# Patient Record
Sex: Female | Born: 1975 | Race: Black or African American | Hispanic: No | Marital: Married | Smoking: Never smoker
Health system: Southern US, Community
[De-identification: ages and names within clinical notes are randomized; demographics above are authoritative.]

## PROBLEM LIST (undated history)

## (undated) ENCOUNTER — Inpatient Hospital Stay (HOSPITAL_COMMUNITY): Payer: Self-pay

## (undated) DIAGNOSIS — K219 Gastro-esophageal reflux disease without esophagitis: Secondary | ICD-10-CM

## (undated) DIAGNOSIS — N979 Female infertility, unspecified: Secondary | ICD-10-CM

## (undated) DIAGNOSIS — E282 Polycystic ovarian syndrome: Secondary | ICD-10-CM

## (undated) DIAGNOSIS — G4730 Sleep apnea, unspecified: Secondary | ICD-10-CM

## (undated) DIAGNOSIS — I10 Essential (primary) hypertension: Secondary | ICD-10-CM

## (undated) DIAGNOSIS — E229 Hyperfunction of pituitary gland, unspecified: Secondary | ICD-10-CM

## (undated) DIAGNOSIS — E785 Hyperlipidemia, unspecified: Secondary | ICD-10-CM

## (undated) DIAGNOSIS — N970 Female infertility associated with anovulation: Secondary | ICD-10-CM

## (undated) DIAGNOSIS — O039 Complete or unspecified spontaneous abortion without complication: Secondary | ICD-10-CM

## (undated) HISTORY — PX: LAPAROSCOPIC OVARIAN CYSTECTOMY: SUR786

## (undated) HISTORY — PX: OTHER SURGICAL HISTORY: SHX169

## (undated) HISTORY — DX: Hyperfunction of pituitary gland, unspecified: E22.9

## (undated) HISTORY — DX: Essential (primary) hypertension: I10

## (undated) HISTORY — DX: Gastro-esophageal reflux disease without esophagitis: K21.9

## (undated) HISTORY — PX: TUBAL LIGATION: SHX77

## (undated) HISTORY — PX: WISDOM TOOTH EXTRACTION: SHX21

## (undated) HISTORY — PX: DILATION AND CURETTAGE OF UTERUS: SHX78

## (undated) HISTORY — DX: Hyperlipidemia, unspecified: E78.5

## (undated) HISTORY — PX: CHOLECYSTECTOMY: SHX55

## (undated) HISTORY — DX: Polycystic ovarian syndrome: E28.2

## (undated) HISTORY — DX: Female infertility associated with anovulation: N97.0

## (undated) HISTORY — DX: Sleep apnea, unspecified: G47.30

## (undated) HISTORY — DX: Complete or unspecified spontaneous abortion without complication: O03.9

## (undated) HISTORY — DX: Female infertility, unspecified: N97.9

---

## 1998-01-26 ENCOUNTER — Ambulatory Visit (HOSPITAL_COMMUNITY): Admission: RE | Admit: 1998-01-26 | Discharge: 1998-01-27 | Payer: Self-pay | Admitting: Surgery

## 1998-06-17 ENCOUNTER — Ambulatory Visit (HOSPITAL_COMMUNITY): Admission: RE | Admit: 1998-06-17 | Discharge: 1998-06-17 | Payer: Self-pay | Admitting: Endocrinology

## 1998-06-17 ENCOUNTER — Encounter: Payer: Self-pay | Admitting: Endocrinology

## 1999-03-02 ENCOUNTER — Ambulatory Visit (HOSPITAL_COMMUNITY): Admission: RE | Admit: 1999-03-02 | Discharge: 1999-03-02 | Payer: Self-pay | Admitting: *Deleted

## 1999-05-08 ENCOUNTER — Emergency Department (HOSPITAL_COMMUNITY): Admission: EM | Admit: 1999-05-08 | Discharge: 1999-05-08 | Payer: Self-pay | Admitting: Emergency Medicine

## 2000-12-19 ENCOUNTER — Other Ambulatory Visit: Admission: RE | Admit: 2000-12-19 | Discharge: 2000-12-19 | Payer: Self-pay | Admitting: *Deleted

## 2001-01-08 ENCOUNTER — Emergency Department (HOSPITAL_COMMUNITY): Admission: EM | Admit: 2001-01-08 | Discharge: 2001-01-08 | Payer: Self-pay | Admitting: Emergency Medicine

## 2002-09-08 ENCOUNTER — Other Ambulatory Visit: Admission: RE | Admit: 2002-09-08 | Discharge: 2002-09-08 | Payer: Self-pay | Admitting: *Deleted

## 2003-10-18 ENCOUNTER — Other Ambulatory Visit: Admission: RE | Admit: 2003-10-18 | Discharge: 2003-10-18 | Payer: Self-pay | Admitting: Obstetrics and Gynecology

## 2005-07-20 ENCOUNTER — Ambulatory Visit: Payer: Self-pay | Admitting: Family Medicine

## 2005-10-02 ENCOUNTER — Ambulatory Visit: Payer: Self-pay | Admitting: Family Medicine

## 2006-01-09 ENCOUNTER — Ambulatory Visit: Payer: Self-pay | Admitting: Family Medicine

## 2006-08-07 ENCOUNTER — Ambulatory Visit: Payer: Self-pay | Admitting: Family Medicine

## 2007-05-05 ENCOUNTER — Emergency Department (HOSPITAL_COMMUNITY): Admission: EM | Admit: 2007-05-05 | Discharge: 2007-05-05 | Payer: Self-pay | Admitting: Emergency Medicine

## 2007-12-24 ENCOUNTER — Encounter (INDEPENDENT_AMBULATORY_CARE_PROVIDER_SITE_OTHER): Payer: Self-pay | Admitting: *Deleted

## 2008-08-03 ENCOUNTER — Ambulatory Visit: Payer: Self-pay | Admitting: Family Medicine

## 2008-08-03 DIAGNOSIS — E785 Hyperlipidemia, unspecified: Secondary | ICD-10-CM

## 2008-08-03 DIAGNOSIS — I10 Essential (primary) hypertension: Secondary | ICD-10-CM

## 2008-08-10 LAB — CONVERTED CEMR LAB
ALT: 19 units/L (ref 0–35)
AST: 22 units/L (ref 0–37)
Albumin: 3.8 g/dL (ref 3.5–5.2)
Alkaline Phosphatase: 53 units/L (ref 39–117)
BUN: 16 mg/dL (ref 6–23)
Basophils Absolute: 0 10*3/uL (ref 0.0–0.1)
Basophils Relative: 0 % (ref 0.0–3.0)
Bilirubin, Direct: 0.1 mg/dL (ref 0.0–0.3)
CO2: 31 meq/L (ref 19–32)
Calcium: 9 mg/dL (ref 8.4–10.5)
Chloride: 105 meq/L (ref 96–112)
Cholesterol: 234 mg/dL (ref 0–200)
Creatinine, Ser: 1 mg/dL (ref 0.4–1.2)
Direct LDL: 169.5 mg/dL
Eosinophils Absolute: 0.2 10*3/uL (ref 0.0–0.7)
Eosinophils Relative: 3.3 % (ref 0.0–5.0)
GFR calc Af Amer: 83 mL/min
GFR calc non Af Amer: 69 mL/min
Glucose, Bld: 88 mg/dL (ref 70–99)
HCT: 40.5 % (ref 36.0–46.0)
HDL: 47.8 mg/dL (ref >39.0)
Hemoglobin: 14.2 g/dL (ref 12.0–15.0)
Lymphocytes Relative: 35.2 % (ref 12.0–46.0)
MCHC: 35.2 g/dL (ref 30.0–36.0)
MCV: 95.5 fL (ref 78.0–100.0)
Monocytes Absolute: 0.2 10*3/uL (ref 0.1–1.0)
Monocytes Relative: 4.2 % (ref 3.0–12.0)
Neutro Abs: 2.8 10*3/uL (ref 1.4–7.7)
Neutrophils Relative %: 57.3 % (ref 43.0–77.0)
Phosphorus: 3.4 mg/dL (ref 2.3–4.6)
Platelets: 247 10*3/uL (ref 150–400)
Potassium: 4 meq/L (ref 3.5–5.1)
RBC: 4.24 M/uL (ref 3.87–5.11)
RDW: 11.9 % (ref 11.5–14.6)
Sodium: 141 meq/L (ref 135–145)
TSH: 0.96 microintl units/mL (ref 0.35–5.50)
Total Bilirubin: 0.6 mg/dL (ref 0.3–1.2)
Total CHOL/HDL Ratio: 4.9
Total Protein: 7.1 g/dL (ref 6.0–8.3)
Triglycerides: 124 mg/dL (ref 0–149)
VLDL: 25 mg/dL (ref 0–40)
WBC: 5 10*3/uL (ref 4.5–10.5)

## 2008-09-09 ENCOUNTER — Encounter (INDEPENDENT_AMBULATORY_CARE_PROVIDER_SITE_OTHER): Payer: Self-pay | Admitting: *Deleted

## 2008-09-09 ENCOUNTER — Ambulatory Visit: Payer: Self-pay | Admitting: Family Medicine

## 2008-09-09 DIAGNOSIS — M545 Low back pain, unspecified: Secondary | ICD-10-CM | POA: Insufficient documentation

## 2008-09-09 DIAGNOSIS — IMO0002 Reserved for concepts with insufficient information to code with codable children: Secondary | ICD-10-CM

## 2008-11-25 IMAGING — CR DG CHEST 2V
2 series · 2 of 2 positions shown · non-contrast
Comparison: none

CLINICAL DATA: Pain below left breast.
 CHEST ? 2 VIEW:

[w chest pa]
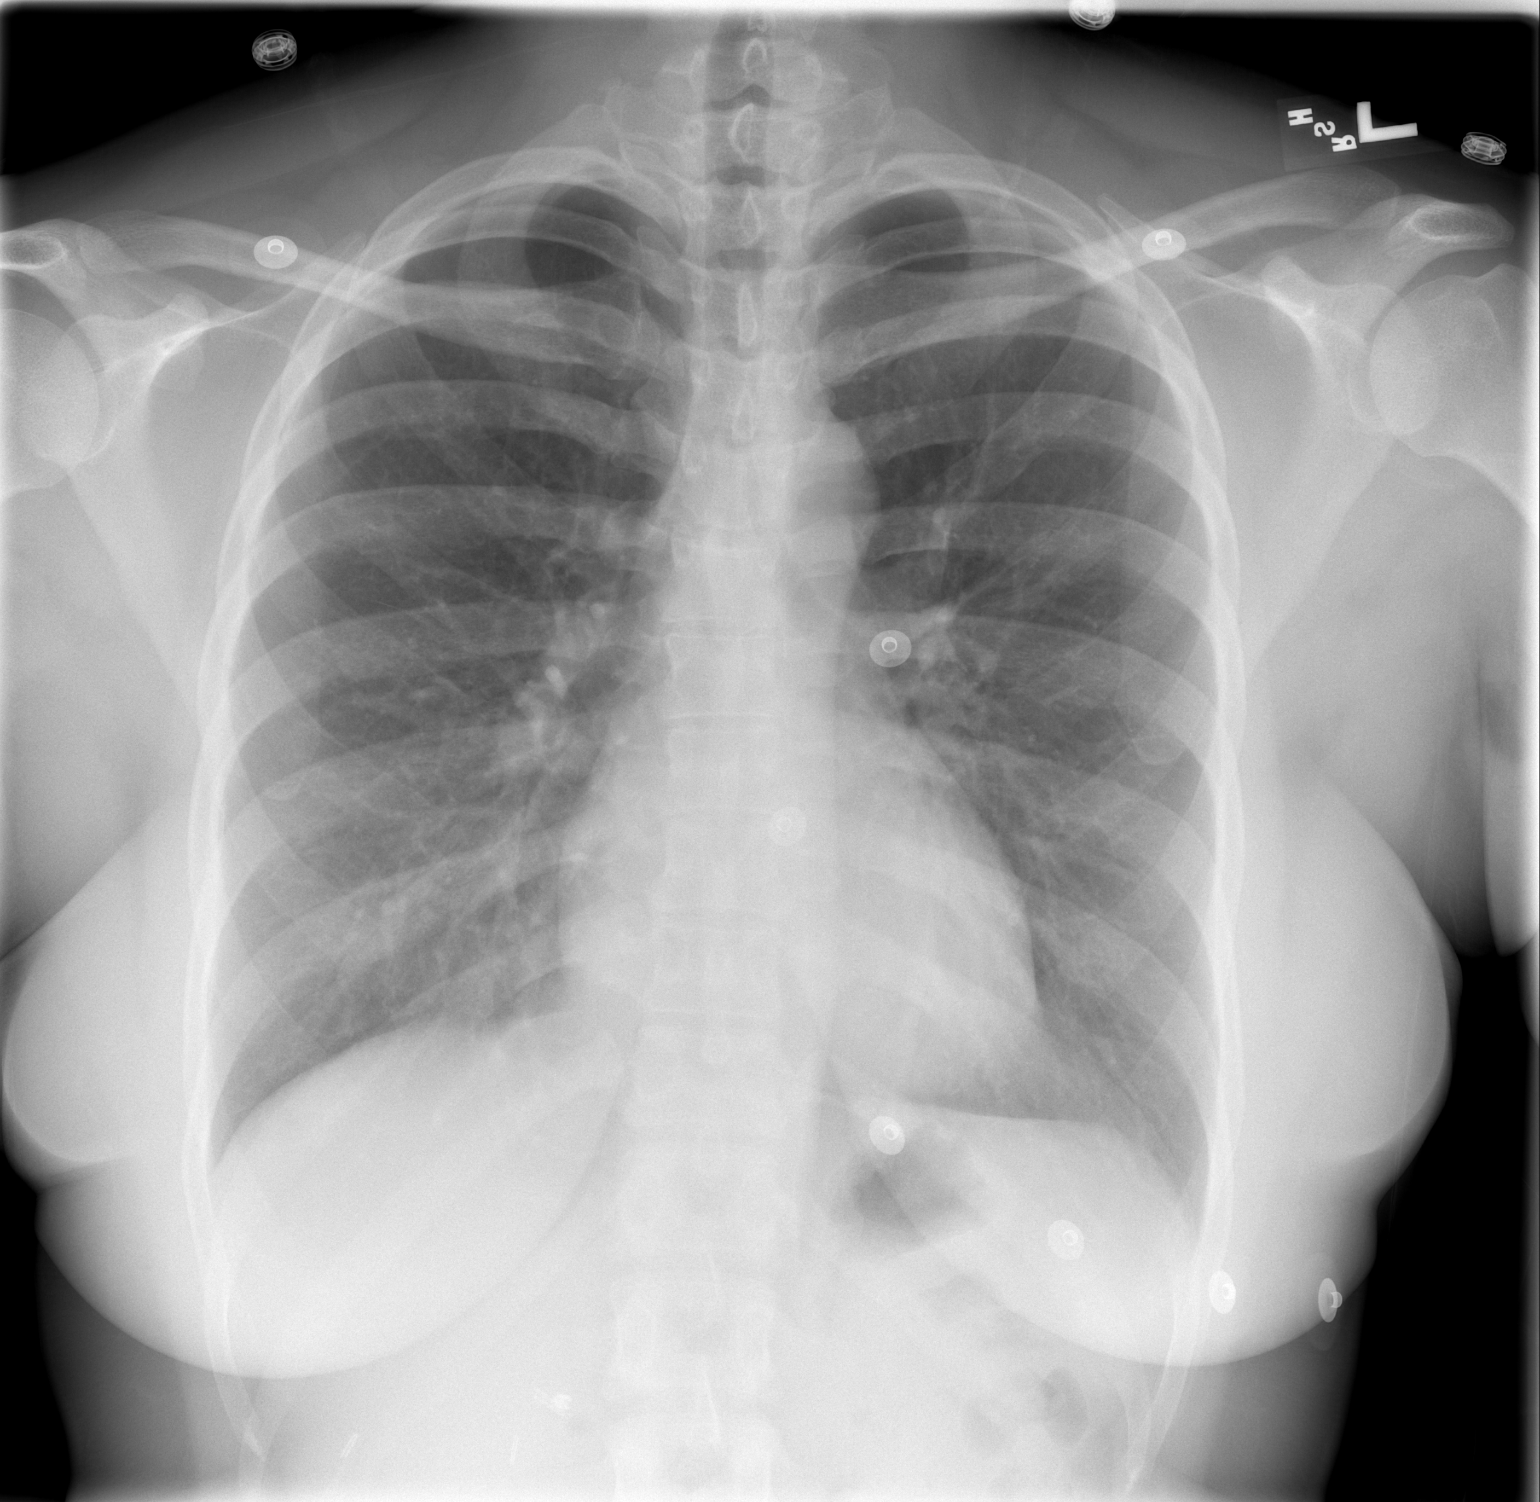

[w chest lat]
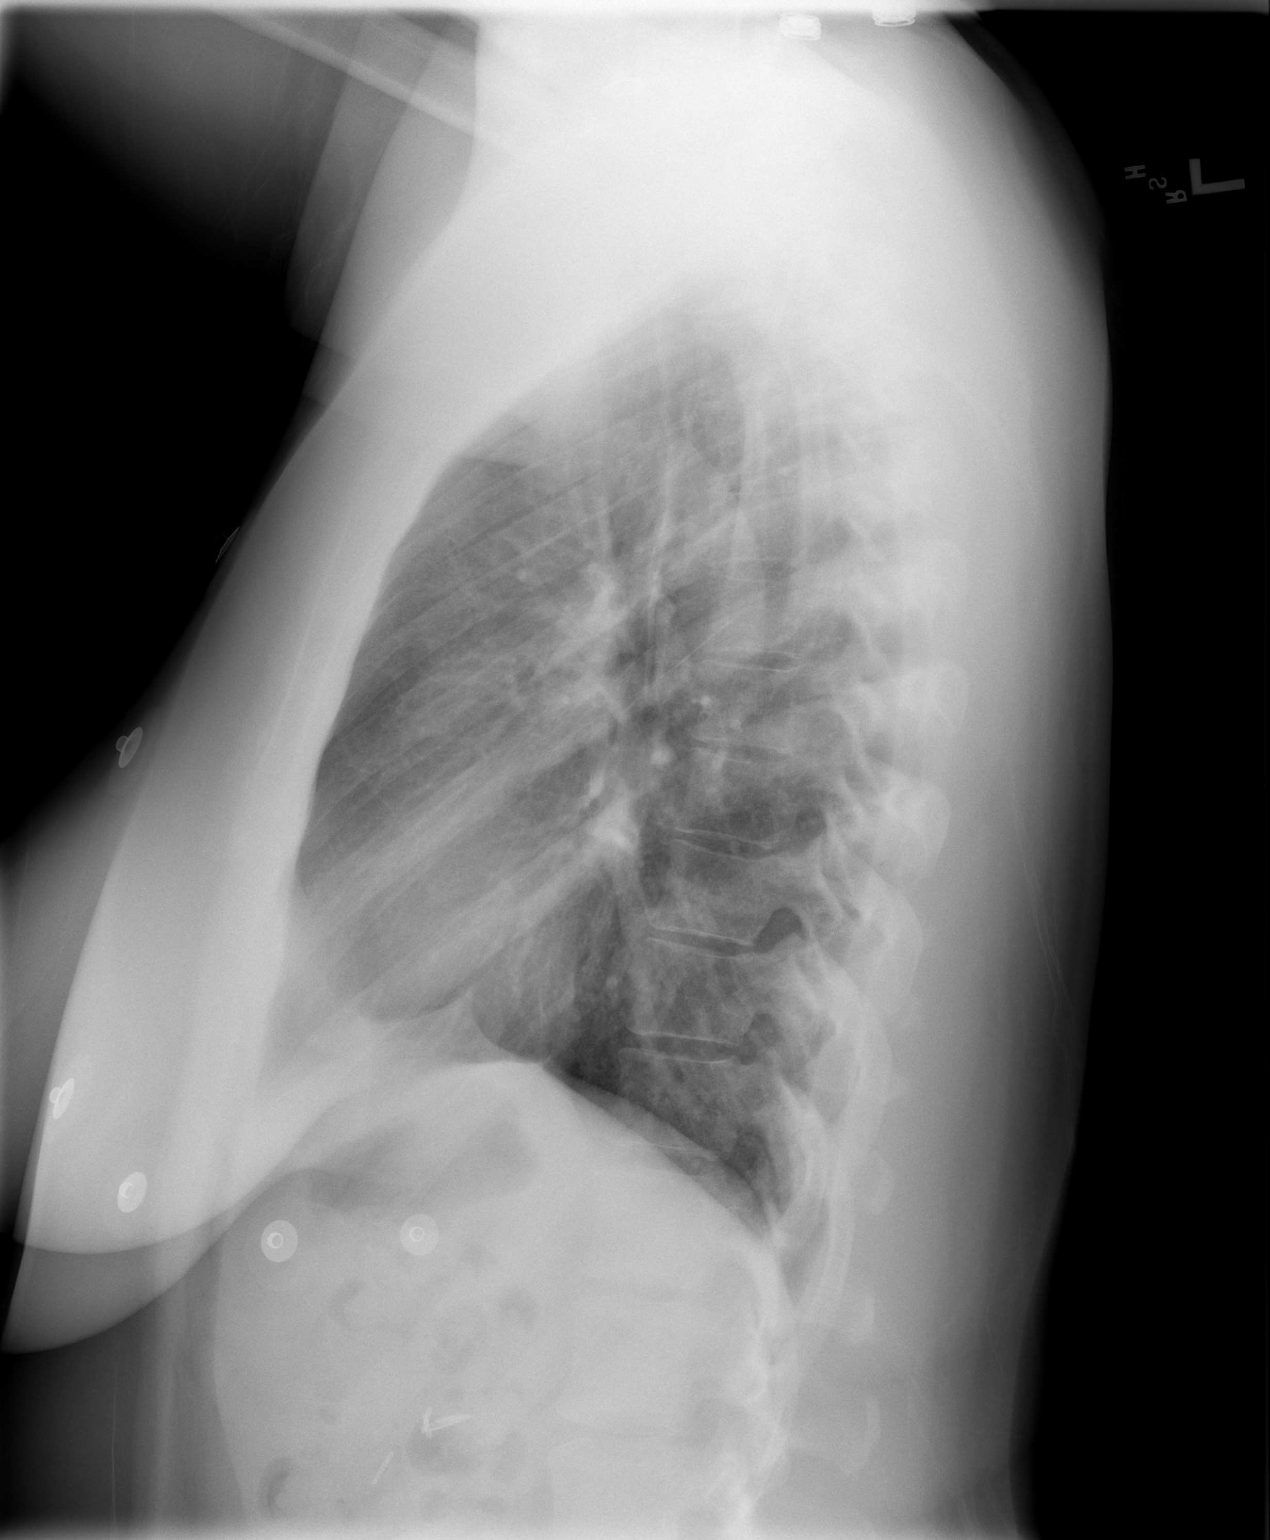

[2 of 2 positions shown; findings below may reference images not displayed]

FINDINGS: Normal cardiac silhouette.  Costophrenic angles are clear.  Normal pulmonary vasculature.  No evidence of effusion, infiltrate or pneumothorax.  No bony abnormality is evident.  Cholecystectomy clips.
IMPRESSION: 1. No acute cardiopulmonary process.
 2. No bony abnormality evident.

## 2008-12-20 ENCOUNTER — Emergency Department (HOSPITAL_COMMUNITY): Admission: EM | Admit: 2008-12-20 | Discharge: 2008-12-20 | Payer: Self-pay | Admitting: Emergency Medicine

## 2009-03-15 ENCOUNTER — Telehealth: Payer: Self-pay | Admitting: Family Medicine

## 2009-03-15 DIAGNOSIS — E282 Polycystic ovarian syndrome: Secondary | ICD-10-CM

## 2009-04-27 ENCOUNTER — Ambulatory Visit (HOSPITAL_COMMUNITY): Admission: RE | Admit: 2009-04-27 | Discharge: 2009-04-27 | Payer: Self-pay | Admitting: Specialist

## 2009-06-10 ENCOUNTER — Ambulatory Visit: Payer: Self-pay | Admitting: Family Medicine

## 2009-06-10 DIAGNOSIS — Z6835 Body mass index (BMI) 35.0-35.9, adult: Secondary | ICD-10-CM

## 2009-06-10 LAB — CONVERTED CEMR LAB
Cholesterol, target level: 200 mg/dL
HDL goal, serum: 40 mg/dL
LDL Goal: 160 mg/dL

## 2009-07-05 ENCOUNTER — Telehealth: Payer: Self-pay | Admitting: Family Medicine

## 2009-12-11 ENCOUNTER — Emergency Department (HOSPITAL_COMMUNITY): Admission: EM | Admit: 2009-12-11 | Discharge: 2009-12-11 | Payer: Self-pay | Admitting: Emergency Medicine

## 2010-01-16 ENCOUNTER — Telehealth: Payer: Self-pay | Admitting: Family Medicine

## 2010-01-30 DIAGNOSIS — O039 Complete or unspecified spontaneous abortion without complication: Secondary | ICD-10-CM

## 2010-01-30 HISTORY — PX: OTHER SURGICAL HISTORY: SHX169

## 2010-01-30 HISTORY — DX: Complete or unspecified spontaneous abortion without complication: O03.9

## 2010-02-22 ENCOUNTER — Ambulatory Visit: Payer: Self-pay | Admitting: Family Medicine

## 2010-02-25 ENCOUNTER — Inpatient Hospital Stay (HOSPITAL_COMMUNITY): Admission: AD | Admit: 2010-02-25 | Discharge: 2010-02-25 | Payer: Self-pay | Admitting: Obstetrics & Gynecology

## 2010-02-27 ENCOUNTER — Telehealth: Payer: Self-pay | Admitting: Family Medicine

## 2010-03-01 ENCOUNTER — Telehealth: Payer: Self-pay | Admitting: Family Medicine

## 2010-08-03 NOTE — Progress Notes (Signed)
Summary: Coughing  Phone Note Call from Patient Call back at Work Phone [phone]   Caller: Patient Call For: Judith Part MD Summary of Call: pt is coughing and not feeling well, what OTC meds can she take?  Initial call taken by: Mervin Hack CMA Duncan Dull),  July 05, 2009 10:27 AM  Follow-up for Phone Call        I would try mucinex DM otc with lots of water  f/u if not imp or if fever or other symptoms  Follow-up by: Judith Part MD,  July 05, 2009 11:21 AM  Additional Follow-up for Phone Call Additional follow up Details #1::        Advised pt. Additional Follow-up by: Lowella Petties CMA,  July 05, 2009 12:34 PM

## 2010-08-03 NOTE — Progress Notes (Signed)
  Phone Note Call from Patient   Caller: Patient Summary of Call: Patient called to tell you that she started bleeding on SAt AM and it started getting heavy so she went to [facility] ER, she also passed something while using the bathroom and she brought the tissue with her to [place]. They told her she probably had a miscarriage. They did a urine pregnancy test and it was negative. They also did a blood test and it was positive for Hcg. They told her to wait 2-4 days and see her GYN for another blood test that it sometimes takes that long to show negative. Patient has a balance at Dr Silvestre Mesi office so they wouldnt make her an appt until she took care of the balance which she was going to do this week. She called their office this am and the nurse is supposed to call her back. ASked her if she wanted me to call for her and she said no, that she was feeling okay and she was at work today. She said she would call me back after she heard back from Dr Silvestre Mesi office. Initial call taken by: Carlton Adam,  February 27, 2010 2:30 PM  Follow-up for Phone Call        thanks for the update-- let me know if anything I can do  please make copy of this phone note and send to Dr Damian Leavell office also Follow-up by: Judith Part MD,  February 27, 2010 2:48 PM      Past Surgical History:    CCY    8/11 - miscarriage   Appended Document:  Phone note faxed to Dr Silvestre Mesi office on 02/27/2010.

## 2010-08-03 NOTE — Assessment & Plan Note (Signed)
Summary: DISCUSS BP MEDICATION,?PREGNANT/CE   Vital Signs:  Patient profile:   35 year old female Height:      65 inches Weight:      200.75 pounds BMI:     33.53 Temp:     98.2 degrees F oral Pulse rate:   76 / minute Pulse rhythm:   regular BP sitting:   132 / 78  (left arm) Cuff size:   large  Vitals Entered By: Lewanda Rife LPN (February 22, 2010 8:47 AM)  Serial Vital Signs/Assessments:  Time      Position  BP       Pulse  Resp  Temp     By                     128/70                         Judith Part MD  CC: discuss med and ? pregnant LMP 01/17/10   History of Present Illness: has pos preg test today trying for 13 years -- lMP was july 19th  is very excited   breasts are tender is sleeping in sports bra  is not nauseated  is more tired and fell asleep in church   Dr Rosemary Holms gave her name -- Labetolol -- for bp in preg  did already come off her ace needs to change her beta blocker from metoprolol no problems with her bp lately  needs appt with OB- Dr Rosemary Holms ? if he will see her early -- is infertility pt -- was on clomid   she has adopted son 42 years old  she does not smoke or drink alcohol at all no otc meds      Allergies: 1)  ! * Metformin 2)  ! Augmentin  Past History:  Past Surgical History: Last updated: 08/03/2008 CCY  Family History: Last updated: 08/03/2008 father- HTN mothe- HTN brotherDM aunts / uncles DM   Social History: Last updated: 08/03/2008 married  adopted son  works at [employer]	[place] , and also post office   Past Medical History: HTN PCOS hyperlipidemia  migraine  infertility- clomid    gyn- [facility]  Review of Systems General:  Complains of fatigue; denies fever, loss of appetite, malaise, and sleep disorder. Eyes:  Denies blurring and eye irritation. CV:  Denies chest pain or discomfort, palpitations, swelling of feet, and swelling of hands. Resp:  Denies cough, shortness of breath, and wheezing. GI:  Denies  abdominal pain, change in bowel habits, indigestion, nausea, and vomiting. GU:  Denies dysuria, hematuria, and urinary frequency. MS:  Denies joint pain. Derm:  Denies itching, lesion(s), poor wound healing, and rash. Neuro:  Denies headaches, numbness, and tingling. Psych:  Denies anxiety and depression. Endo:  Denies cold intolerance, excessive thirst, excessive urination, and heat intolerance. Heme:  Denies abnormal bruising and bleeding.  Physical Exam  General:  overweight but generally well appearing  Head:  normocephalic, atraumatic, and no abnormalities observed.   Eyes:  vision grossly intact, pupils equal, pupils round, and pupils reactive to light.  no conjunctival pallor, injection or icterus  Mouth:  MMM, throat clear  Neck:  supple with full rom and no masses or thyromegally, no JVD or carotid bruit  Chest Wall:  No deformities, masses, or tenderness noted. Lungs:  Normal respiratory effort, chest expands symmetrically. Lungs are clear to auscultation, no crackles or wheezes. Heart:  Normal rate and regular rhythm. S1  and S2 normal without gallop, murmur, click, rub or other extra sounds. Abdomen:  Bowel sounds positive,abdomen soft and non-tender without masses, organomegaly or hernias noted.  no suprapubic tenderness or fullness Msk:  No deformity or scoliosis noted of thoracic or lumbar spine.  no acute joint changes Extremities:  No clubbing, cyanosis, edema, or deformity noted with normal full range of motion of all joints.   Neurologic:  sensation intact to light touch, gait normal, and DTRs symmetrical and normal.   Skin:  Intact without suspicious lesions or rashes Cervical Nodes:  No lymphadenopathy noted Psych:  normal affect, talkative and pleasant    Impression & Recommendations:  Problem # 1:  PREGNANCY WITH HISTORY OF INFERTILITY (ICD-V23.0) doing well  will control bp with labetolol -- for ob f/u  stop other meds besides metformin and pnv  disc  expectations and recommended reading  disc diet/ exercise Orders: Obstetric Referral (Obstetric) Urine Pregnancy Test  (81191)  Problem # 2:  HYPERTENSION, BENIGN ESSENTIAL (ICD-401.1) Assessment: Unchanged  due to pregnancy- pt stopped ace will change metoprolol to labetolol for HTN and monitor closely for f/u with OB will call if dizziness or other side eff  The following medications were removed from the medication list:    Prinivil 5 Mg Tabs (Lisinopril) .Marland Kitchen... 1 by mouth once daily Her updated medication list for this problem includes:    Labetalol Hcl 100 Mg Tabs (Labetalol hcl) .Marland Kitchen... 1 by mouth two times a day  BP today: 132/78 Prior BP: 140/98 (06/10/2009)  Prior 10 Yr Risk Heart Disease: Not enough information (06/10/2009)  Labs Reviewed: K+: 4.0 (08/03/2008) Creat: : 1.0 (08/03/2008)   Chol: 234 (08/03/2008)   HDL: 47.8 (08/03/2008)   LDL: DEL (08/03/2008)   TG: 124 (08/03/2008)  Orders: Prescription Created Electronically [phone])  Complete Medication List: 1)  Organic Prenatal Vitamin  .Marland Kitchen.. 1 by mouth once daily 2)  Labetalol Hcl 100 Mg Tabs (Labetalol hcl) .Marland Kitchen.. 1 by mouth two times a day 3)  Metformin Hcl 500 Mg Xr24h-tab (Metformin hcl) .... Take one tablet by mouth at bedtime. 4)  Natalvit Tabs (Prenatal vit-fe fumarate-fa) .Marland Kitchen.. 1 by mouth once daily  Patient Instructions: 1)  get the book " what to expect when you are expecting" 2)  another is "pregnancy week by week" 3)  continue prenatal vitamin  4)  continue metformin and start labetolol  5)  stop all other medicines 6)  avoid caffine and alcohol  7)  avoid otc meds  8)  we will do ref at check out to obgyn  9)  eat regular meals and get good water intake  Prescriptions: LABETALOL HCL 100 MG TABS (LABETALOL HCL) 1 by mouth two times a day  #60 x 11   Entered and Authorized by:   Judith Part MD   Signed by:   Judith Part MD on 02/22/2010   Method used:   Electronically to        [facility] [address] ([facility])       [address], [address]       [place], [place]  [address]       Ph: [phone]       Fax: [phone]   RxID:   [id number]   Current Allergies (reviewed today): ! * METFORMIN ! AUGMENTIN  Laboratory Results   Urine Tests  Date/Time Received: February 22, 2010 8:50 AM  Date/Time Reported: February 22, 2010  8:50 AM     Urine HCG: positive

## 2010-08-03 NOTE — Progress Notes (Signed)
  Phone Note Call from Patient   Caller: Patient Summary of Call: Called patient she has an appt with Dr Billy Coast tomm, 03/02/2010 at 1:45.  Initial call taken by: Carlton Adam,  March 01, 2010 2:57 PM  Follow-up for Phone Call        good- thanks for the update Follow-up by: Judith Part MD,  March 01, 2010 3:50 PM

## 2010-08-03 NOTE — Progress Notes (Signed)
Summary: Metoprolol Tartrate 25mg  ?refill  Phone Note Refill Request Call back at [phone] Message from:  [facility] on January 16, 2010 4:53 PM  Refills Requested: Medication #1:  METOPROLOL TARTRATE 25 MG TABS 1 by mouth two times a day   Last Refilled: 12/16/2009 [facility] sent refill request electronically for Meotprolol tartrate 25mg . In 06/2009 note you requested pt to come back in in 2 months if she did not getcounseling from OB GYN about safe blood pressure control. Pt did not make appt and is not reachable by phone now. Please advise.    Method Requested: Telephone to Pharmacy Initial call taken by: Lewanda Rife LPN,  January 16, 2010 4:55 PM  Follow-up for Phone Call        please send for last obgyn note ask her to f/u with me within a month  can refil 1 month-px written on EMR for call in  Follow-up by: Judith Part MD,  January 16, 2010 5:04 PM  Additional Follow-up for Phone Call Additional follow up Details #1::        Left message for patient to call back. Medication phoned to [facility] [address] pharmacy as instructed. Left message with refill for pt to call for appt. also since so far cannot reach by phone. Lewanda Rife LPN  January 16, 2010 5:32 PM     Additional Follow-up for Phone Call Additional follow up Details #2::    Patient notified as instructed by telephone. Pt said would have to ck her work schedule and will call for appt before med runs out. [phone] to get last OBGYN note faxed to our office. Med records said pt would have to request. I called pt and she will call and get last note for GYN faxed to DR Carrah Eppolito. Lewanda Rife LPN  January 17, 2010 11:40 AM   New/Updated Medications: METOPROLOL TARTRATE 25 MG TABS (METOPROLOL TARTRATE) 1 by mouth two times a day Prescriptions: METOPROLOL TARTRATE 25 MG TABS (METOPROLOL TARTRATE) 1 by mouth two times a day  #60 x 0   Entered and Authorized by:   Judith Part MD   Signed by:    Lewanda Rife LPN on 45/40/9811   Method used:   Telephoned to ...       [facility] [address] (retail)       [address], [address]       [place], [place]  [address]       Ph: [phone]       Fax: [phone]   RxID:   [id number]

## 2010-09-15 LAB — HCG, QUANTITATIVE, PREGNANCY: hCG, Beta Chain, Quant, S: 12 m[IU]/mL — ABNORMAL HIGH (ref <5)

## 2010-09-15 LAB — POCT PREGNANCY, URINE: Preg Test, Ur: NEGATIVE

## 2010-09-18 LAB — URINALYSIS, ROUTINE W REFLEX MICROSCOPIC
Nitrite: NEGATIVE
Protein, ur: NEGATIVE mg/dL
Specific Gravity, Urine: 1.032 — ABNORMAL HIGH (ref 1.005–1.030)
Urobilinogen, UA: 1 mg/dL (ref 0.0–1.0)

## 2010-09-18 LAB — URINE MICROSCOPIC-ADD ON

## 2010-09-19 ENCOUNTER — Inpatient Hospital Stay (HOSPITAL_COMMUNITY)
Admission: AD | Admit: 2010-09-19 | Discharge: 2010-09-19 | Disposition: A | Discharge status: Home / Self Care | Payer: 59 | Source: Ambulatory Visit | Attending: Obstetrics and Gynecology | Admitting: Obstetrics and Gynecology

## 2010-09-19 ENCOUNTER — Inpatient Hospital Stay (HOSPITAL_COMMUNITY): Payer: 59

## 2010-09-19 DIAGNOSIS — O021 Missed abortion: Secondary | ICD-10-CM | POA: Insufficient documentation

## 2010-09-20 ENCOUNTER — Other Ambulatory Visit: Payer: Self-pay | Admitting: Obstetrics and Gynecology

## 2010-09-20 ENCOUNTER — Ambulatory Visit (HOSPITAL_COMMUNITY)
Admission: RE | Admit: 2010-09-20 | Discharge: 2010-09-20 | Disposition: A | Discharge status: Home / Self Care | Payer: 59 | Source: Ambulatory Visit | Attending: Obstetrics and Gynecology | Admitting: Obstetrics and Gynecology

## 2010-09-20 DIAGNOSIS — O021 Missed abortion: Secondary | ICD-10-CM | POA: Insufficient documentation

## 2010-09-20 LAB — CBC
HCT: 41.4 % (ref 36.0–46.0)
Hemoglobin: 14.3 g/dL (ref 12.0–15.0)
MCH: 32.3 pg (ref 26.0–34.0)
MCHC: 34.5 g/dL (ref 30.0–36.0)
RBC: 4.43 MIL/uL (ref 3.87–5.11)

## 2010-09-21 NOTE — Op Note (Signed)
  Leslie Campbell, Leslie Campbell               ACCOUNT NO.:  [id number]  MEDICAL RECORD NO.:  [id number]           PATIENT TYPE:  O  LOCATION:  [facility]                          [facility]  PHYSICIAN:  Lenoard Aden, M.D.DATE OF BIRTH:  1976/05/30  DATE OF PROCEDURE:  09/20/2010 DATE OF DISCHARGE:                              OPERATIVE REPORT   PREOPERATIVE DIAGNOSES: 1. A 14-week intrauterine pregnancy with spontaneous rupture of     membranes. 2. Fetal demise in utero. 3. Probable cervical insufficiency.  POSTOPERATIVE DIAGNOSES: 1. A 14-week intrauterine pregnancy with spontaneous rupture of     membranes. 2. Fetal demise in utero. 3. Probable cervical insufficiency.  PROCEDURE:  Late trimester D and E.  SURGEON:  Lenoard Aden, MD  ASSISTANT:  None.  ANESTHESIA:  General and local.  ESTIMATED BLOOD LOSS:  50 mL.  COMPLICATIONS:  None.  DRAINS:  None.  COUNTS:  Correct.  The patient went to recovery in good condition.  SPECIMEN:  Products of conception sent for permanent tissue analysis in addition to being sent for chromosomal analysis and FISH.  BRIEF DESCRIPTION OF PROCEDURE:  After being apprised of risks of anesthesia, infection, bleeding, injury to abdominal organs, need for repair due to uterine perforation with possible need for repair, the patient was brought to the operating room after consents were signed. Upon entering the operating room, she was administered a general anesthetic without complications. She was prepped and draped in usual sterile fashion, catheterized until the bladder was empty.  Exam under anesthesia reveals an retroflexed 14 weeks size uterus and no adnexal masses.  Cervix was dilated with tissue noted at the external cervical os.  A dilute paracervical block was placed using 20 mL of a dilute 0.25% Marcaine solution.  At this time, cervix easily dilated up to #39 [facility] dilator, 12-mm suction curette placed.  Visualization  reveals products of conception and placenta.  Calvarium and fetal parts are noted upon aspiration. Blunt curettage in a 4-quadrant method reveals cavity to be empty. Repeat suction and curettage confirms good hemostasis is noted.  All instruments were removed.  The patient tolerated the procedure well. Tissue was collected and sent for chromosomal analysis as noted.  The patient was transferred to the recovery in good condition.     Lenoard Aden, M.D.     RJT/MEDQ  D:  09/20/2010  T:  09/21/2010  Job:  [id number]  Electronically Signed by Olivia Mackie M.D. on 09/21/2010 01:39:33 PM

## 2010-10-03 LAB — TISSUE HYBRIDIZATION TO NCBH

## 2010-10-31 ENCOUNTER — Telehealth: Payer: Self-pay | Admitting: *Deleted

## 2010-10-31 MED ORDER — LABETALOL HCL 200 MG PO TABS: 100.0000 mg | ORAL_TABLET | Freq: Two times a day (BID) | ORAL | Status: DC

## 2010-10-31 NOTE — Telephone Encounter (Signed)
Px written for call in   

## 2010-10-31 NOTE — Telephone Encounter (Signed)
Medication phoned to [facility] [address] pharmacy as instructed.  

## 2010-10-31 NOTE — Telephone Encounter (Signed)
Fax from [facility] [address].  Labetalol 100 mg's is on back order, they are asking to change to labetalol 200 mg's, one half by mouth twice a day, # 30.

## 2010-10-31 NOTE — Telephone Encounter (Signed)
That is fine  Px written for call in   

## 2010-11-17 ENCOUNTER — Telehealth: Payer: Self-pay | Admitting: *Deleted

## 2010-11-17 NOTE — Telephone Encounter (Signed)
No -- I don't want to do that without checking her pulse and checking her out F/u with first avail please Thanks for updating me

## 2010-11-17 NOTE — Telephone Encounter (Signed)
Patient notified as instructed by telephone. Pt scheduled appt with Dr Caryl Never on Sat Clinic 11/18/10 at 9:30 am.

## 2010-11-17 NOTE — Telephone Encounter (Signed)
Patient says that her blood pressure has been elevated that last couple of times she has checked it. This morning it was 150/101. She is asking if she should increase the lebetalol. Please advise.

## 2010-11-18 ENCOUNTER — Ambulatory Visit (INDEPENDENT_AMBULATORY_CARE_PROVIDER_SITE_OTHER): Payer: 59 | Admitting: Family Medicine

## 2010-11-18 VITALS — BP 158/100 | HR 70 | Temp 98.1°F | Wt 204.0 lb

## 2010-11-18 DIAGNOSIS — I10 Essential (primary) hypertension: Secondary | ICD-10-CM

## 2010-11-18 NOTE — Progress Notes (Signed)
  Subjective:    Patient ID: Leslie Campbell, female    DOB: Feb 04, 1976, 35 y.o.   MRN: [id number]  HPI Patient is seen for elevated blood pressure.  History of PCOS and known hypertension. Miscarriage in March. Blood pressure elevated somewhat since then. Previously on labetalol one  4 times daily. Had recently been on a dosage of 200 mg one half tablet twice daily. This past Wednesday noted blood pressure was quite high with headache. She increased labetalol to 1 whole tablet twice daily. No headaches since then. No dizziness. Denies chest pain. She is trying to get pregnant-and thus wishes to stay on Labetalol. No nonsteroidal use. Denies alcohol use. Watching her sodium intake closely.  Past Medical History  Diagnosis Date  . HTN (hypertension)   . PCOS (polycystic ovarian syndrome)   . Hyperlipidemia   . Migraine   . Infertility, female     clomid  . Miscarriage 01/2010   Past Surgical History  Procedure Date  . Ccy     does not have a smoking history on file. She does not have any smokeless tobacco history on file. Her alcohol and drug histories not on file. family history includes Diabetes in her brother and other and Hypertension in her father and mother. Allergies  Allergen Reactions  . WUJ:WJXBJYNWGNF+AOZHYQMVH+QIONGEXBMW Acid+Aspartame   . Metformin      Review of Systems  Constitutional: Negative for fever and chills.  Respiratory: Negative for cough and shortness of breath.   Cardiovascular: Negative for chest pain, palpitations and leg swelling.  Neurological: Negative for dizziness and syncope.       No headaches since Wednesday.       Objective:   Physical Exam  Constitutional: She appears well-developed and well-nourished.  HENT:  Head: Normocephalic.  Cardiovascular: Normal rate, regular rhythm and normal heart sounds.   No murmur heard. Pulmonary/Chest: No respiratory distress. She has no wheezes. She has no rales.  Musculoskeletal: She exhibits no  edema.          Assessment & Plan:  Hypertension suboptimally controlled. Titrate labetalol to 200 mg 3 times a day. Followup with primary physician in one to 2 weeks

## 2010-11-18 NOTE — Patient Instructions (Signed)
Increase labetalol to 1 tablet 3 times daily. Schedule followup with your primary physician in one week

## 2011-02-19 ENCOUNTER — Telehealth: Payer: Self-pay | Admitting: Family Medicine

## 2011-02-19 MED ORDER — LABETALOL HCL 200 MG PO TABS: 100.0000 mg | ORAL_TABLET | Freq: Three times a day (TID) | ORAL | Status: DC

## 2011-02-19 NOTE — Telephone Encounter (Signed)
Advised pt, she said she will have to call back to schedule appt.

## 2011-02-19 NOTE — Telephone Encounter (Signed)
[facility] needs [facility]etalol refil  Done and in IN box Pt needs f/u with me when able for bp visit

## 2011-02-19 NOTE — Telephone Encounter (Signed)
Left message with gentleman for pt to call back. Completed form faxed to [phone].

## 2011-04-09 ENCOUNTER — Encounter: Payer: Self-pay | Admitting: Family Medicine

## 2011-04-10 ENCOUNTER — Ambulatory Visit (INDEPENDENT_AMBULATORY_CARE_PROVIDER_SITE_OTHER): Payer: 59 | Admitting: Family Medicine

## 2011-04-10 ENCOUNTER — Encounter: Payer: Self-pay | Admitting: Family Medicine

## 2011-04-10 VITALS — BP 150/92 | HR 76 | Temp 98.3°F | Ht 65.0 in | Wt 206.0 lb

## 2011-04-10 DIAGNOSIS — E785 Hyperlipidemia, unspecified: Secondary | ICD-10-CM

## 2011-04-10 DIAGNOSIS — I10 Essential (primary) hypertension: Secondary | ICD-10-CM

## 2011-04-10 DIAGNOSIS — E282 Polycystic ovarian syndrome: Secondary | ICD-10-CM

## 2011-04-10 LAB — D-DIMER, QUANTITATIVE: D-Dimer, Quant: 0.23

## 2011-04-10 LAB — I-STAT 8, (EC8 V) (CONVERTED LAB)
Acid-Base Excess: 2
Chloride: 106
Glucose, Bld: 85
Hemoglobin: 16.3 — ABNORMAL HIGH
Potassium: 3.8
Sodium: 139
TCO2: 28
pH, Ven: 7.432 — ABNORMAL HIGH

## 2011-04-10 LAB — POCT I-STAT CREATININE: Operator id: 198171

## 2011-04-10 LAB — POCT CARDIAC MARKERS: Myoglobin, poc: 74.5

## 2011-04-10 MED ORDER — LISINOPRIL-HYDROCHLOROTHIAZIDE 10-12.5 MG PO TABS: 1.0000 | ORAL_TABLET | Freq: Every day | ORAL | Status: DC

## 2011-04-10 NOTE — Assessment & Plan Note (Signed)
On metformin Was unable to ovulate - 2 misc , use of clomid - no longer trying to get pregnant  Will work on wt loss and also continue exercise  Lab before f/u in 2 weeks  Disc low glycemic/ fat diet

## 2011-04-10 NOTE — Progress Notes (Signed)
Subjective:    Patient ID: Leslie Campbell, female    DOB: 06/10/1976, 35 y.o.   MRN: [id number]  HPI  Here for f/u of HTN  bp today is 164/100 Is on labetelol 200 mg   Had another miscarriage in 2nd trimester - incompetant cervix  Then a faintly pos test in sept - a chemical pregnancy - not pregnant now  She did take clomid briefly   Plans to not get pregnant again  Has 68 year old son who is adopted  Plans to adopt again - she called today   Does generally not ovulate  Not on birth control   Few headaches- none lately  No swelling in her legs  Last 3 weeks started exercise and gave up sodas  Is waling for exercise or exercise bike     Was stressed - a little better now  Patient Active Problem List  Diagnoses  . POLYCYSTIC OVARIAN DISEASE  . HYPERLIPIDEMIA  . OBESITY  . HYPERTENSION, BENIGN ESSENTIAL  . BACK PAIN, LUMBAR, WITH RADICULOPATHY   Past Medical History  Diagnosis Date  . HTN (hypertension)   . PCOS (polycystic ovarian syndrome)   . Hyperlipidemia   . Migraine   . Infertility, female     clomid  . Miscarriage 01/2010   Past Surgical History  Procedure Date  . Ccy   . Miscarriage 01/2010   History  Substance Use Topics  . Smoking status: Never Smoker   . Smokeless tobacco: Not on file  . Alcohol Use: Not on file   Family History  Problem Relation Age of Onset  . Hypertension Mother   . Hypertension Father   . Diabetes Brother   . Diabetes Other    Allergies  Allergen Reactions  . WUJ:WJXBJYNWGNF+AOZHYQMVH+QIONGEXBMW Acid+Aspartame   . Metformin    Current Outpatient Prescriptions on File Prior to Visit  Medication Sig Dispense Refill  . Prenatal Vit-Fe Fumarate-FA (PRENATAL MULTIVITAMIN) tablet Take 1 tablet by mouth daily.        . metFORMIN (GLUCOPHAGE-XR) 500 MG 24 hr tablet Take 500 mg by mouth at bedtime.            Review of Systems Review of Systems  Constitutional: Negative for fever, appetite change, fatigue and  unexpected weight change.  Eyes: Negative for pain and visual disturbance.  Respiratory: Negative for cough and shortness of breath.   Cardiovascular: Negative for cp or palpitations    Gastrointestinal: Negative for nausea, diarrhea and constipation.  Genitourinary: Negative for urgency and frequency.  Skin: Negative for pallor or rash   Neurological: Negative for weakness, light-headedness, numbness , pos for occ ha  Hematological: Negative for adenopathy. Does not bruise/bleed easily.  Psychiatric/Behavioral: Negative for dysphoric mood. The patient is not nervous/anxious.          Objective:   Physical Exam  Constitutional: She appears well-developed and well-nourished. No distress.       overwt and well appearing   HENT:  Head: Normocephalic and atraumatic.  Eyes: Conjunctivae and EOM are normal. Pupils are equal, round, and reactive to light. No scleral icterus.  Neck: Normal range of motion. Neck supple. No JVD present. Carotid bruit is not present. No thyromegaly present.  Cardiovascular: Normal rate, regular rhythm, normal heart sounds and intact distal pulses.  Exam reveals no gallop.   No murmur heard. Pulmonary/Chest: Effort normal and breath sounds normal. No respiratory distress. She has no wheezes.  Abdominal: Soft. Bowel sounds are normal. She exhibits no distension, no  abdominal bruit and no mass. There is no tenderness.  Musculoskeletal: Normal range of motion. She exhibits no edema and no tenderness.  Lymphadenopathy:    She has no cervical adenopathy.  Neurological: She is alert. She has normal reflexes. No cranial nerve deficit. Coordination normal.  Skin: Skin is warm and dry. No rash noted. No erythema. No pallor.  Psychiatric: She has a normal mood and affect.          Assessment & Plan:

## 2011-04-10 NOTE — Assessment & Plan Note (Signed)
This is worse- but is no longer trying to get pregnant so can get more aggressive with tx Will stop the beta blocker which does not seem to be working Replace with lisinopril hct 10-12.5- she has tolerated this in the past inst to update if problems or side eff Continue exercise and low sodium diet  F/u 1 mo with lab (to [facility]) in 2 weeks for HTN lab and lipids Urged to work on wt loss

## 2011-04-10 NOTE — Patient Instructions (Signed)
Stop labetelol  Start lisinopril hct  One pill daily- if any side effects or problems let me know  Check bp when you can  Avoid sodium in diet as much as possible  Keep up the great exercise  Drink lots of water  Schedule fasting labs here in 1 month to send to [facility] (bring this paper with you)- for comp met profile/ lipids/ cbc with diff/ tsh for 401.1  Follow up with me in 1 month

## 2011-04-10 NOTE — Assessment & Plan Note (Signed)
Pt is eating better and starting to exercise Fasting lab in about 2 weeks  Disc at f/u Rev low sat fat diet today

## 2011-05-09 ENCOUNTER — Other Ambulatory Visit: Payer: Self-pay | Admitting: Family Medicine

## 2011-05-09 DIAGNOSIS — E785 Hyperlipidemia, unspecified: Secondary | ICD-10-CM

## 2011-05-09 DIAGNOSIS — I10 Essential (primary) hypertension: Secondary | ICD-10-CM

## 2011-05-14 ENCOUNTER — Other Ambulatory Visit (INDEPENDENT_AMBULATORY_CARE_PROVIDER_SITE_OTHER): Payer: 59

## 2011-05-14 DIAGNOSIS — E7800 Pure hypercholesterolemia, unspecified: Secondary | ICD-10-CM

## 2011-05-14 DIAGNOSIS — Z0000 Encounter for general adult medical examination without abnormal findings: Secondary | ICD-10-CM

## 2011-05-16 ENCOUNTER — Ambulatory Visit (INDEPENDENT_AMBULATORY_CARE_PROVIDER_SITE_OTHER): Payer: 59 | Admitting: Family Medicine

## 2011-05-16 ENCOUNTER — Encounter: Payer: Self-pay | Admitting: Family Medicine

## 2011-05-16 VITALS — BP 128/80 | HR 84 | Temp 98.2°F | Ht 65.0 in | Wt 203.2 lb

## 2011-05-16 DIAGNOSIS — E785 Hyperlipidemia, unspecified: Secondary | ICD-10-CM

## 2011-05-16 DIAGNOSIS — I10 Essential (primary) hypertension: Secondary | ICD-10-CM

## 2011-05-16 DIAGNOSIS — Z23 Encounter for immunization: Secondary | ICD-10-CM

## 2011-05-16 MED ORDER — LISINOPRIL-HYDROCHLOROTHIAZIDE 10-12.5 MG PO TABS: 1.0000 | ORAL_TABLET | Freq: Every day | ORAL | Status: DC

## 2011-05-16 NOTE — Patient Instructions (Signed)
Blood pressure is much better  If migraines continue please call - I would consider adding back the beta blocker - since it acts as a migraine preventative Watch caffeine Keep exercising  Will let you know when labs return from [facility]  Follow up in about 6 months  Flu shot today

## 2011-05-16 NOTE — Assessment & Plan Note (Signed)
Making the diet effort  Pend labs done on 12th from [facility] Rev low sat fat diet

## 2011-05-16 NOTE — Assessment & Plan Note (Signed)
Much improved on lisinopril hct  If migraines return - may have to add back the beta blocker for prev  Disc caffeine/ lifestyle  Will get back to exercise and wt loss effort Still pend labs from [facility]

## 2011-05-16 NOTE — Progress Notes (Signed)
Subjective:    Patient ID: Leslie Campbell, female    DOB: August 10, 1975, 35 y.o.   MRN: [id number]  HPI Here for bp check after changing to lisinopril/ hct - and bp is 130/80 Was on the exercise bike every day -- 30-45 -min  Got away from it for football season- driving  Ready to start back now - happy to do that   Feels ok - no side effects from the medicine  Did have a migraine once - and has not had one in years (did stop her beta blocker)  Not on any hormones currently   Waiting  on [facility] results still   Patient Active Problem List  Diagnoses  . POLYCYSTIC OVARIAN DISEASE  . HYPERLIPIDEMIA  . OBESITY  . HYPERTENSION, BENIGN ESSENTIAL  . BACK PAIN, LUMBAR, WITH RADICULOPATHY   Past Medical History  Diagnosis Date  . HTN (hypertension)   . PCOS (polycystic ovarian syndrome)   . Hyperlipidemia   . Migraine   . Infertility, female     clomid  . Miscarriage 01/2010  . Miscarriage 2012    times 2  . Infertility associated with anovulation    Past Surgical History  Procedure Date  . Ccy   . Miscarriage 01/2010   History  Substance Use Topics  . Smoking status: Never Smoker   . Smokeless tobacco: Not on file  . Alcohol Use: Not on file   Family History  Problem Relation Age of Onset  . Hypertension Mother   . Hypertension Father   . Diabetes Brother   . Diabetes Other    Allergies  Allergen Reactions  . WUJ:[id number]+AOZHYQMVH+QIONGEXBMW Acid+Aspartame   . Metformin    Current Outpatient Prescriptions on File Prior to Visit  Medication Sig Dispense Refill  . Prenatal Vit-Fe Fumarate-FA (PRENATAL MULTIVITAMIN) tablet Take 1 tablet by mouth daily.        . metFORMIN (GLUCOPHAGE-XR) 500 MG 24 hr tablet Take 500 mg by mouth at bedtime.             Review of Systems Review of Systems  Constitutional: Negative for fever, appetite change, fatigue and unexpected weight change.  Eyes: Negative for pain and visual disturbance.  Respiratory: Negative for  cough and shortness of breath.   Cardiovascular: Negative for cp or palpitations    Gastrointestinal: Negative for nausea, diarrhea and constipation.  Genitourinary: Negative for urgency and frequency.  Skin: Negative for pallor or rash   Neurological: Negative for weakness, light-headedness, numbness and pos for one headache Hematological: Negative for adenopathy. Does not bruise/bleed easily.  Psychiatric/Behavioral: Negative for dysphoric mood. The patient is not nervous/anxious.          Objective:   Physical Exam  Constitutional: She appears well-developed and well-nourished. No distress.       overwt and well appearing   HENT:  Head: Normocephalic and atraumatic.  Mouth/Throat: Oropharynx is clear and moist.  Eyes: Conjunctivae and EOM are normal. Pupils are equal, round, and reactive to light.  Neck: Normal range of motion. Neck supple. No JVD present. Carotid bruit is not present. No thyromegaly present.  Cardiovascular: Normal rate, regular rhythm, normal heart sounds and intact distal pulses.  Exam reveals no gallop.   Pulmonary/Chest: Effort normal and breath sounds normal. No respiratory distress. She has no wheezes.  Abdominal: Soft. Bowel sounds are normal.  Musculoskeletal: She exhibits no edema.  Lymphadenopathy:    She has no cervical adenopathy.  Neurological: She is alert. She has normal reflexes. Coordination  normal.       No tremor   Skin: Skin is warm and dry. No rash noted. No erythema. No pallor.  Psychiatric: She has a normal mood and affect.          Assessment & Plan:

## 2011-09-10 ENCOUNTER — Other Ambulatory Visit: Payer: 59

## 2011-09-18 ENCOUNTER — Ambulatory Visit: Payer: 59 | Admitting: Family Medicine

## 2011-11-16 ENCOUNTER — Ambulatory Visit: Payer: 59 | Admitting: Family Medicine

## 2011-12-11 ENCOUNTER — Telehealth: Payer: Self-pay | Admitting: Family Medicine

## 2011-12-11 NOTE — Telephone Encounter (Signed)
Patient received a no show charge for d.o.s. 11/16/11.  Patient said when the teleminder called she chose to cancel the appointment and it said someone would call her,but she didn't receive a call.

## 2012-01-17 NOTE — Telephone Encounter (Signed)
Sent email to charge correction to remove no show charge.

## 2012-08-09 ENCOUNTER — Emergency Department (HOSPITAL_COMMUNITY)
Admission: EM | Admit: 2012-08-09 | Discharge: 2012-08-09 | Disposition: A | Discharge status: Home / Self Care | Payer: 59 | Source: Home / Self Care | Attending: Family Medicine | Admitting: Family Medicine

## 2012-08-09 ENCOUNTER — Encounter (HOSPITAL_COMMUNITY): Payer: Self-pay | Admitting: Emergency Medicine

## 2012-08-09 ENCOUNTER — Emergency Department (INDEPENDENT_AMBULATORY_CARE_PROVIDER_SITE_OTHER): Payer: 59

## 2012-08-09 DIAGNOSIS — S93409A Sprain of unspecified ligament of unspecified ankle, initial encounter: Secondary | ICD-10-CM

## 2012-08-09 MED ORDER — HYDROCODONE-ACETAMINOPHEN 5-325 MG PO TABS: 1.0000 | ORAL_TABLET | Freq: Four times a day (QID) | ORAL | Status: DC | PRN

## 2012-08-09 NOTE — ED Provider Notes (Signed)
History     CSN: [id number]  Arrival date & time 08/09/12  1448   First MD Initiated Contact with Patient 08/09/12 1628      Chief Complaint  Patient presents with  . Ankle Injury    (Consider location/radiation/quality/duration/timing/severity/associated sxs/prior treatment) Patient is a 37 y.o. female presenting with ankle pain. The history is provided by the patient.  Ankle Pain Location:  Ankle Time since incident:  2 hours Injury: yes   Mechanism of injury comment:  Roller skating Ankle location:  R ankle Pain details:    Quality:  Aching   Radiates to:  Does not radiate   Severity:  Moderate   Progression:  Worsening Chronicity:  New Dislocation: no   Relieved by:  Ice and immobilization   Past Medical History  Diagnosis Date  . HTN (hypertension)   . PCOS (polycystic ovarian syndrome)   . Hyperlipidemia   . Migraine   . Infertility, female     clomid  . Miscarriage 01/2010  . Miscarriage 2012    times 2  . Infertility associated with anovulation     Past Surgical History  Procedure Laterality Date  . Ccy    . Miscarriage  01/2010    Family History  Problem Relation Age of Onset  . Hypertension Mother   . Hypertension Father   . Diabetes Brother   . Diabetes Other     History  Substance Use Topics  . Smoking status: Never Smoker   . Smokeless tobacco: Not on file  . Alcohol Use: No    OB History   Grav Para Term Preterm Abortions TAB SAB Ect Mult Living                  Review of Systems  Constitutional: Negative.   Musculoskeletal: Positive for joint swelling and gait problem.    Allergies  Review of patient's allergies indicates no active allergies.  Home Medications   Current Outpatient Rx  Name  Route  Sig  Dispense  Refill  . LABETALOL HCL PO   Oral   Take 400 mg by mouth.         . Prenatal Vit-Fe Fumarate-FA (PRENATAL MULTIVITAMIN) tablet   Oral   Take 1 tablet by mouth daily.           Marland Kitchen  HYDROcodone-acetaminophen (NORCO/VICODIN) 5-325 MG per tablet   Oral   Take 1 tablet by mouth every 6 (six) hours as needed for pain.   15 tablet   0   . EXPIRED: lisinopril-hydrochlorothiazide (ZESTORETIC) 10-12.5 MG per tablet   Oral   Take 1 tablet by mouth daily.   90 tablet   3   . metFORMIN (GLUCOPHAGE-XR) 500 MG 24 hr tablet   Oral   Take 500 mg by mouth at bedtime.             BP 132/90  Pulse 85  Temp(Src) 98.5 F (36.9 C) (Oral)  Resp 16  SpO2 99%  LMP 07/26/2012  Physical Exam  Nursing note and vitals reviewed. Constitutional: She is oriented to person, place, and time. She appears well-developed and well-nourished.  Musculoskeletal: She exhibits tenderness.       Right ankle: She exhibits decreased range of motion, swelling and ecchymosis. She exhibits no deformity. Tenderness. Medial malleolus tenderness found. No head of 5th metatarsal and no proximal fibula tenderness found. Achilles tendon exhibits pain. Achilles tendon exhibits no defect.  Neurological: She is alert and oriented to person, place, and time.  Skin: Skin is warm and dry.    ED Course  Procedures (including critical care time)  Labs Reviewed - No data to display Dg Ankle Complete Right  08/09/2012  *RADIOLOGY REPORT*  Clinical Data:  Fall  RIGHT ANKLE - COMPLETE 3+ VIEW  Comparison:  None.  Findings:  There is no evidence of fracture, dislocation, or joint effusion.  There is no evidence of arthropathy or other focal bone abnormality.  Soft tissues are unremarkable.  IMPRESSION: Negative.   Original Report Authenticated By: Janeece Riggers, M.D.      1. Ankle sprain and strain, right, initial encounter       MDM          Linna Hoff, MD 08/09/12 [phone]

## 2012-08-09 NOTE — ED Notes (Signed)
Right ankle injury today while roller skating.  Patient reports hearing a crack. Right dp is 2 plus, able to move toes, toes feel slight tingly.

## 2014-04-18 ENCOUNTER — Telehealth: Payer: Self-pay | Admitting: Family Medicine

## 2014-04-18 DIAGNOSIS — E282 Polycystic ovarian syndrome: Secondary | ICD-10-CM

## 2014-04-18 DIAGNOSIS — Z0000 Encounter for general adult medical examination without abnormal findings: Secondary | ICD-10-CM | POA: Insufficient documentation

## 2014-04-18 NOTE — Telephone Encounter (Signed)
Message copied by Abner Greenspan on Sun Apr 18, 2014  2:41 PM ------      Message from: Ellamae Sia      Created: Mon Apr 12, 2014 11:12 AM      Regarding: Lab orders for Monday, 10.19.15       Patient is scheduled for CPX labs, please order future labs, Thanks , Leslie Campbell       ------

## 2014-04-19 ENCOUNTER — Other Ambulatory Visit (INDEPENDENT_AMBULATORY_CARE_PROVIDER_SITE_OTHER): Payer: 59

## 2014-04-19 DIAGNOSIS — Z0000 Encounter for general adult medical examination without abnormal findings: Secondary | ICD-10-CM

## 2014-04-19 DIAGNOSIS — E282 Polycystic ovarian syndrome: Secondary | ICD-10-CM

## 2014-04-19 LAB — COMPREHENSIVE METABOLIC PANEL
ALT: 20 U/L (ref 0–35)
AST: 25 U/L (ref 0–37)
Albumin: 3.2 g/dL — ABNORMAL LOW (ref 3.5–5.2)
Alkaline Phosphatase: 47 U/L (ref 39–117)
BUN: 17 mg/dL (ref 6–23)
CALCIUM: 8.6 mg/dL (ref 8.4–10.5)
CHLORIDE: 108 meq/L (ref 96–112)
CO2: 21 meq/L (ref 19–32)
Creatinine, Ser: 1.1 mg/dL (ref 0.4–1.2)
GFR: 69.49 mL/min (ref >60.00)
Glucose, Bld: 89 mg/dL (ref 70–99)
POTASSIUM: 3.9 meq/L (ref 3.5–5.1)
SODIUM: 140 meq/L (ref 135–145)
TOTAL PROTEIN: 6.6 g/dL (ref 6.0–8.3)
Total Bilirubin: 1.2 mg/dL (ref 0.2–1.2)

## 2014-04-19 LAB — CBC WITH DIFFERENTIAL/PLATELET
BASOS ABS: 0 10*3/uL (ref 0.0–0.1)
Basophils Relative: 0.5 % (ref 0.0–3.0)
Eosinophils Absolute: 0.2 10*3/uL (ref 0.0–0.7)
Eosinophils Relative: 4.7 % (ref 0.0–5.0)
HCT: 40.8 % (ref 36.0–46.0)
HEMOGLOBIN: 13.5 g/dL (ref 12.0–15.0)
LYMPHS PCT: 39.1 % (ref 12.0–46.0)
Lymphs Abs: 1.5 10*3/uL (ref 0.7–4.0)
MCHC: 33.2 g/dL (ref 30.0–36.0)
MCV: 95.4 fl (ref 78.0–100.0)
MONOS PCT: 10.1 % (ref 3.0–12.0)
Monocytes Absolute: 0.4 10*3/uL (ref 0.1–1.0)
NEUTROS PCT: 45.6 % (ref 43.0–77.0)
Neutro Abs: 1.8 10*3/uL (ref 1.4–7.7)
PLATELETS: 235 10*3/uL (ref 150.0–400.0)
RBC: 4.27 Mil/uL (ref 3.87–5.11)
RDW: 13.2 % (ref 11.5–15.5)
WBC: 3.9 10*3/uL — ABNORMAL LOW (ref 4.0–10.5)

## 2014-04-19 LAB — LIPID PANEL
CHOL/HDL RATIO: 5
Cholesterol: 195 mg/dL (ref 0–200)
HDL: 42.4 mg/dL (ref >39.00)
LDL CALC: 140 mg/dL — AB (ref 0–99)
NONHDL: 152.6
Triglycerides: 64 mg/dL (ref 0.0–149.0)
VLDL: 12.8 mg/dL (ref 0.0–40.0)

## 2014-04-19 LAB — TSH: TSH: 0.8 u[IU]/mL (ref 0.35–4.50)

## 2014-04-19 LAB — HEMOGLOBIN A1C: Hgb A1c MFr Bld: 5.2 % (ref 4.6–6.5)

## 2014-04-26 ENCOUNTER — Encounter: Payer: Self-pay | Admitting: Family Medicine

## 2014-04-26 ENCOUNTER — Ambulatory Visit (INDEPENDENT_AMBULATORY_CARE_PROVIDER_SITE_OTHER): Payer: 59 | Admitting: Family Medicine

## 2014-04-26 ENCOUNTER — Encounter (INDEPENDENT_AMBULATORY_CARE_PROVIDER_SITE_OTHER): Payer: Self-pay

## 2014-04-26 VITALS — BP 162/108 | HR 74 | Temp 98.8°F | Ht 64.5 in | Wt 206.8 lb

## 2014-04-26 DIAGNOSIS — I10 Essential (primary) hypertension: Secondary | ICD-10-CM

## 2014-04-26 DIAGNOSIS — Z0000 Encounter for general adult medical examination without abnormal findings: Secondary | ICD-10-CM

## 2014-04-26 DIAGNOSIS — J019 Acute sinusitis, unspecified: Secondary | ICD-10-CM | POA: Insufficient documentation

## 2014-04-26 DIAGNOSIS — E785 Hyperlipidemia, unspecified: Secondary | ICD-10-CM

## 2014-04-26 DIAGNOSIS — Z23 Encounter for immunization: Secondary | ICD-10-CM

## 2014-04-26 DIAGNOSIS — J0100 Acute maxillary sinusitis, unspecified: Secondary | ICD-10-CM

## 2014-04-26 DIAGNOSIS — E669 Obesity, unspecified: Secondary | ICD-10-CM

## 2014-04-26 DIAGNOSIS — E282 Polycystic ovarian syndrome: Secondary | ICD-10-CM

## 2014-04-26 MED ORDER — AMOXICILLIN-POT CLAVULANATE 875-125 MG PO TABS: 1.0000 | ORAL_TABLET | Freq: Two times a day (BID) | ORAL | Status: DC

## 2014-04-26 MED ORDER — LABETALOL HCL 200 MG PO TABS: ORAL_TABLET | ORAL | Status: DC

## 2014-04-26 NOTE — Assessment & Plan Note (Signed)
On metformin Lab Results  Component Value Date   HGBA1C 5.2 04/19/2014   Will continue this and effort towards wt loss

## 2014-04-26 NOTE — Assessment & Plan Note (Signed)
Reviewed health habits including diet and exercise and skin cancer prevention Reviewed appropriate screening tests for age  Also reviewed health mt list, fam hx and immunization status , as well as social and family history   See HPI Labs reviewed  

## 2014-04-26 NOTE — Assessment & Plan Note (Signed)
Pt stopped lisinopril hct while trying to get pregnant  bp is way up  Will inc labetolol to 2 1/2 pills bid-watching for side eff or low pulse rate  F/u planned  Disc imp of low sodium diet and wt control  Will also get guidance from GYN

## 2014-04-26 NOTE — Assessment & Plan Note (Signed)
Disc goals for lipids and reasons to control them Rev labs with pt Rev low sat fat diet in detail

## 2014-04-26 NOTE — Patient Instructions (Signed)
Increase your labetelol to 2 1/2 pills twice daily  If any side effects or problems let me know  Keep an eye on your blood pressure  Take the augmentin for sinus infection  Use the app "myfitnesspal" to log in calories and exercise for weight loss Aim for 30 or more minutes of exercise daily (5 days per week)   Tdap and flu shot today   Follow up with me in about 4-6 weeks to see how bp is

## 2014-04-26 NOTE — Assessment & Plan Note (Signed)
Cover with augmentin  Disc symptomatic care - see instructions on AVS  Update if not starting to improve in a week or if worsening

## 2014-04-26 NOTE — Progress Notes (Signed)
Pre visit review using our clinic review tool, if applicable. No additional management support is needed unless otherwise documented below in the visit note. 

## 2014-04-26 NOTE — Assessment & Plan Note (Signed)
Discussed how this problem influences overall health and the risks it imposes  Reviewed plan for weight loss with lower calorie diet (via better food choices and also portion control or program like weight watchers) and exercise building up to or more than 30 minutes 5 days per week including some aerobic activity    Pt having a hard time loosing given pcos  Disc plan to use myfitness pal to log in diet and exercise  Inc exercise to 5 d per week -30 min or more  Will continue to follow

## 2014-04-26 NOTE — Progress Notes (Signed)
Subjective:    Patient ID: Leslie Campbell, female    DOB: 10/20/1975, 38 y.o.   MRN: [id number]  HPI Here for health maintenance exam and to review chronic medical problems    Wt is up 3 lb with bmi of 34 She walks twice per week and exercises (DVD)  Does not eat much overall and watches what she eats  She has cut back on her sugar and stopped soft drinks   Has tried nutra system in the past -no changes (but food is filling)     bp is up  BP Readings from Last 3 Encounters:  04/26/14 162/108  08/09/12 132/90  05/16/11 128/80   no longer on lisinopril/ hct - is going to try in vitro- so she stopped it  Usually is about 155/90    On labetolol- she takes 800 mg daily now (400 mg twice daily)    Td 10/04- due for a Tdap  Pap nl in 12/14 - due upcoming  Saw a specialist in [place] -and bp was up then also    Self breast exam-no lumps   Flu shot - needs that   PCOS On metformin On parlodel - this is because her prolactin was a bit elevated   Symptoms also of sinusitis Facial pain and pressure  Yellow nasal d/c Going on for more than 2 weeks  Post drip  Cough -dry    Cholesterol Lab Results  Component Value Date   CHOL 195 04/19/2014   CHOL 234* 08/03/2008   Lab Results  Component Value Date   HDL 42.40 04/19/2014   HDL 47.8 08/03/2008   Lab Results  Component Value Date   LDLCALC 140* 04/19/2014   Lab Results  Component Value Date   TRIG 64.0 04/19/2014   TRIG 124 08/03/2008   Lab Results  Component Value Date   CHOLHDL 5 04/19/2014   CHOLHDL 4.9 CALC 08/03/2008   Lab Results  Component Value Date   LDLDIRECT 169.5 08/03/2008    LDL is improved but not at goal   A1C - very good at 5.2    Patient Active Problem List   Diagnosis Date Noted  . Encounter for health maintenance examination 04/18/2014  . Obesity 06/10/2009  . POLYCYSTIC OVARIAN DISEASE 03/15/2009  . BACK PAIN, LUMBAR, WITH RADICULOPATHY 09/09/2008  . Hyperlipidemia 08/03/2008  .  HYPERTENSION, BENIGN ESSENTIAL 08/03/2008   Past Medical History  Diagnosis Date  . HTN (hypertension)   . PCOS (polycystic ovarian syndrome)   . Hyperlipidemia   . Migraine   . Infertility, female     clomid  . Miscarriage 01/2010  . Miscarriage 2012    times 2  . Infertility associated with anovulation    Past Surgical History  Procedure Laterality Date  . Ccy    . Miscarriage  01/2010   History  Substance Use Topics  . Smoking status: Never Smoker   . Smokeless tobacco: Not on file  . Alcohol Use: No   Family History  Problem Relation Age of Onset  . Hypertension Mother   . Hypertension Father   . Diabetes Brother   . Diabetes Other    No Known Allergies Current Outpatient Prescriptions on File Prior to Visit  Medication Sig Dispense Refill  . LABETALOL HCL PO Take 400 mg by mouth.      . metFORMIN (GLUCOPHAGE-XR) 500 MG 24 hr tablet Take 500 mg by mouth at bedtime.        . Prenatal Vit-Fe Fumarate-FA (  PRENATAL MULTIVITAMIN) tablet Take 1 tablet by mouth daily.         No current facility-administered medications on file prior to visit.     Review of Systems Review of Systems  Constitutional: Negative for fever, appetite change, and unexpected weight change.  ENt pos for cong and rhinorrhea and sinus pain  Eyes: Negative for pain and visual disturbance.  Respiratory: Negative for wheeze and shortness of breath.   Cardiovascular: Negative for cp or palpitations    Gastrointestinal: Negative for nausea, diarrhea and constipation.  Genitourinary: Negative for urgency and frequency.  Skin: Negative for pallor or rash   Neurological: Negative for weakness, light-headedness, numbness and headaches.  Hematological: Negative for adenopathy. Does not bruise/bleed easily.  Psychiatric/Behavioral: Negative for dysphoric mood. The patient is not nervous/anxious.         Objective:   Physical Exam  Constitutional: She appears well-developed and well-nourished. No  distress.  obese and well appearing   HENT:  Head: Normocephalic and atraumatic.  Right Ear: External ear normal.  Left Ear: External ear normal.  Mouth/Throat: Oropharynx is clear and moist.  Nares are injected and congested  Bilateral maxillary sinus tenderness   TMs dull    Eyes: Conjunctivae and EOM are normal. Pupils are equal, round, and reactive to light. Right eye exhibits no discharge. Left eye exhibits no discharge. No scleral icterus.  Neck: Normal range of motion. Neck supple. No JVD present. Carotid bruit is not present. No thyromegaly present.  Cardiovascular: Normal rate, regular rhythm, normal heart sounds and intact distal pulses.  Exam reveals no gallop.   Pulmonary/Chest: Effort normal and breath sounds normal. No respiratory distress. She has no wheezes. She has no rales.  Abdominal: Soft. Bowel sounds are normal. She exhibits no distension, no abdominal bruit and no mass. There is no tenderness.  Musculoskeletal: She exhibits no edema and no tenderness.  Lymphadenopathy:    She has no cervical adenopathy.  Neurological: She is alert. She has normal reflexes. No cranial nerve deficit. She exhibits normal muscle tone. Coordination normal.  Skin: Skin is warm and dry. No rash noted. No erythema. No pallor.  Psychiatric: She has a normal mood and affect.          Assessment & Plan:   Problem List Items Addressed This Visit     Cardiovascular and Mediastinum   HYPERTENSION, BENIGN ESSENTIAL - Primary     Pt stopped lisinopril hct while trying to get pregnant  bp is way up  Will inc labetolol to 2 1/2 pills bid-watching for side eff or low pulse rate  F/u planned  Disc imp of low sodium diet and wt control  Will also get guidance from GYN    Relevant Medications      labetalol (NORMODYNE) tablet     Respiratory   Sinusitis, acute     Cover with augmentin  Disc symptomatic care - see instructions on AVS  Update if not starting to improve in a week or if  worsening      Relevant Medications      amoxicillin-clavulanate (AUGMENTIN) tablet 875-125 mg     Endocrine   POLYCYSTIC OVARIAN DISEASE      On metformin Lab Results  Component Value Date   HGBA1C 5.2 04/19/2014   Will continue this and effort towards wt loss       Other   Hyperlipidemia     Disc goals for lipids and reasons to control them Rev labs with pt Rev low  sat fat diet in detail      Relevant Medications      labetalol (NORMODYNE) tablet   Obesity     Discussed how this problem influences overall health and the risks it imposes  Reviewed plan for weight loss with lower calorie diet (via better food choices and also portion control or program like weight watchers) and exercise building up to or more than 30 minutes 5 days per week including some aerobic activity    Pt having a hard time loosing given pcos  Disc plan to use myfitness pal to log in diet and exercise  Inc exercise to 5 d per week -30 min or more  Will continue to follow     Encounter for health maintenance examination     Reviewed health habits including diet and exercise and skin cancer prevention Reviewed appropriate screening tests for age  Also reviewed health mt list, fam hx and immunization status , as well as social and family history   See HPI Labs reviewed       Other Visit Diagnoses   Need for prophylactic vaccination and inoculation against influenza        Relevant Orders       Flu Vaccine QUAD 36+ mos PF IM (Fluarix Quad PF) (Completed)    Need for Tdap vaccination        Relevant Orders       Tdap vaccine greater than or equal to 7yo IM (Completed)

## 2014-05-24 ENCOUNTER — Ambulatory Visit: Payer: 59 | Admitting: Family Medicine

## 2014-06-09 ENCOUNTER — Encounter: Payer: Self-pay | Admitting: Family Medicine

## 2014-06-09 ENCOUNTER — Ambulatory Visit (INDEPENDENT_AMBULATORY_CARE_PROVIDER_SITE_OTHER): Payer: 59 | Admitting: Family Medicine

## 2014-06-09 VITALS — BP 150/106 | HR 79 | Temp 98.1°F | Ht 64.5 in | Wt 205.5 lb

## 2014-06-09 DIAGNOSIS — I10 Essential (primary) hypertension: Secondary | ICD-10-CM

## 2014-06-09 MED ORDER — HYDROCHLOROTHIAZIDE 25 MG PO TABS: 25.0000 mg | ORAL_TABLET | Freq: Every day | ORAL | Status: DC

## 2014-06-09 NOTE — Patient Instructions (Signed)
Continue labetolol  Add hctz 25 mg each am  Drink water/ avoid sodium  Exercise  Follow up with me in 2-3 weeks for visit and lab (not fasting)

## 2014-06-09 NOTE — Progress Notes (Signed)
Pre visit review using our clinic review tool, if applicable. No additional management support is needed unless otherwise documented below in the visit note. 

## 2014-06-09 NOTE — Progress Notes (Signed)
Subjective:    Patient ID: Leslie Campbell, female    DOB: 1975-08-22, 38 y.o.   MRN: [id number]  HPI Here for f/u of HTN   Wt is down a lb  She is working with app for wt loss - may not get enough calories -- 1200-1500 per day    Feeling fine  slt headache yesterday -not bad No nsaids  Drinking water-did increase it   (some soda occ)  Does not add salt  Some processed foods however - she does watch labels   Exercise - walking regularly  Also using stairs more at work    BP Readings from Last 3 Encounters:  06/09/14 150/106  04/26/14 162/108  08/09/12 132/90    On labedolol 300 mg bid - no side eff at all and pulse rate is fine    Not on oral contraceptive  Period started after last visit    Has not it checked since last visit  She cancelled her IVF appt until Jan   Patient Active Problem List   Diagnosis Date Noted  . Sinusitis, acute 04/26/2014  . Encounter for health maintenance examination 04/18/2014  . Obesity 06/10/2009  . POLYCYSTIC OVARIAN DISEASE 03/15/2009  . BACK PAIN, LUMBAR, WITH RADICULOPATHY 09/09/2008  . Hyperlipidemia 08/03/2008  . HYPERTENSION, BENIGN ESSENTIAL 08/03/2008   Past Medical History  Diagnosis Date  . HTN (hypertension)   . PCOS (polycystic ovarian syndrome)   . Hyperlipidemia   . Migraine   . Infertility, female     clomid  . Miscarriage 01/2010  . Miscarriage 2012    times 2  . Infertility associated with anovulation    Past Surgical History  Procedure Laterality Date  . Ccy    . Miscarriage  01/2010   History  Substance Use Topics  . Smoking status: Never Smoker   . Smokeless tobacco: Never Used  . Alcohol Use: No   Family History  Problem Relation Age of Onset  . Hypertension Mother   . Hypertension Father   . Diabetes Brother   . Diabetes Other    No Known Allergies Current Outpatient Prescriptions on File Prior to Visit  Medication Sig Dispense Refill  . bromocriptine (PARLODEL) 2.5 MG tablet Take 1  tablet by mouth daily.    Marland Kitchen labetalol (NORMODYNE) 200 MG tablet Take 2 1/2 pills twice daily by mouth 150 tablet 2  . Prenatal Vit-Fe Fumarate-FA (PRENATAL MULTIVITAMIN) tablet Take 1 tablet by mouth daily.       No current facility-administered medications on file prior to visit.     Review of Systems Review of Systems  Constitutional: Negative for fever, appetite change, fatigue and unexpected weight change.  Eyes: Negative for pain and visual disturbance.  Respiratory: Negative for cough and shortness of breath.   Cardiovascular: Negative for cp or palpitations    Gastrointestinal: Negative for nausea, diarrhea and constipation.  Genitourinary: Negative for urgency and frequency.  Skin: Negative for pallor or rash   Neurological: Negative for weakness, light-headedness, numbness and pos for occ ha  Hematological: Negative for adenopathy. Does not bruise/bleed easily.  Psychiatric/Behavioral: Negative for dysphoric mood. The patient is not nervous/anxious.         Objective:   Physical Exam  Constitutional: She appears well-developed and well-nourished. No distress.  obese and well appearing   HENT:  Head: Normocephalic and atraumatic.  Mouth/Throat: Oropharynx is clear and moist.  Eyes: Conjunctivae and EOM are normal. Pupils are equal, round, and reactive to light. No  scleral icterus.  Neck: Normal range of motion. Neck supple. No JVD present. Carotid bruit is not present. No thyromegaly present.  Cardiovascular: Normal rate, regular rhythm and normal heart sounds.  Exam reveals no gallop.   Pulmonary/Chest: Effort normal and breath sounds normal. No respiratory distress. She has no wheezes. She has no rales.  No rales   Abdominal: Soft. Bowel sounds are normal. She exhibits no distension, no abdominal bruit and no mass. There is no tenderness. There is no rebound and no guarding.  Musculoskeletal: She exhibits no edema.  Lymphadenopathy:    She has no cervical adenopathy.    Neurological: She is alert. She has normal reflexes. No cranial nerve deficit. She exhibits normal muscle tone. Coordination normal.  Skin: Skin is warm and dry. No rash noted. No erythema. No pallor.  Psychiatric: She has a normal mood and affect.          Assessment & Plan:   Problem List Items Addressed This Visit      Cardiovascular and Mediastinum   HYPERTENSION, BENIGN ESSENTIAL - Primary    Not in control  Still interested in getting pregnant and not on OC  Did put off IVF Will continue labetalol and add hctz 25 F/u for re check and labs as planned     Relevant Medications      hydrochlorothiazide tablet

## 2014-06-13 NOTE — Assessment & Plan Note (Signed)
Not in control  Still interested in getting pregnant and not on OC  Did put off IVF Will continue labetalol and add hctz 25 F/u for re check and labs as planned

## 2014-06-23 ENCOUNTER — Ambulatory Visit (INDEPENDENT_AMBULATORY_CARE_PROVIDER_SITE_OTHER): Payer: 59 | Admitting: Family Medicine

## 2014-06-23 ENCOUNTER — Encounter: Payer: Self-pay | Admitting: Family Medicine

## 2014-06-23 VITALS — BP 132/88 | HR 81 | Temp 98.4°F | Ht 64.5 in | Wt 204.0 lb

## 2014-06-23 DIAGNOSIS — I10 Essential (primary) hypertension: Secondary | ICD-10-CM

## 2014-06-23 MED ORDER — HYDROCHLOROTHIAZIDE 25 MG PO TABS
25.0000 mg | ORAL_TABLET | Freq: Every day | ORAL | Status: DC
Start: 2014-06-23 — End: 2015-04-29

## 2014-06-23 MED ORDER — LABETALOL HCL 200 MG PO TABS: ORAL_TABLET | ORAL | Status: DC

## 2014-06-23 NOTE — Progress Notes (Signed)
Subjective:    Patient ID: Leslie Campbell, female    DOB: [birth year], 38 y.o.   MRN: [id number]  HPI Here for f/u of HTN   Last visit added hctz  She feels pretty good  She does notice some inc urinary frequency  She did have "tunnel vision" one day = fleeting  No problem since   No headaches  Taking care of herself    BP Readings from Last 3 Encounters:  06/23/14 138/90  06/09/14 150/106  04/26/14 162/108    Re check 132/88   Patient Active Problem List   Diagnosis Date Noted  . Sinusitis, acute 04/26/2014  . Encounter for health maintenance examination 04/18/2014  . Obesity 06/10/2009  . POLYCYSTIC OVARIAN DISEASE 03/15/2009  . BACK PAIN, LUMBAR, WITH RADICULOPATHY 09/09/2008  . Hyperlipidemia 08/03/2008  . HYPERTENSION, BENIGN ESSENTIAL 08/03/2008   Past Medical History  Diagnosis Date  . HTN (hypertension)   . PCOS (polycystic ovarian syndrome)   . Hyperlipidemia   . Migraine   . Infertility, female     clomid  . Miscarriage 01/2010  . Miscarriage 2012    times 2  . Infertility associated with anovulation    Past Surgical History  Procedure Laterality Date  . Ccy    . Miscarriage  01/2010   History  Substance Use Topics  . Smoking status: Never Smoker   . Smokeless tobacco: Never Used  . Alcohol Use: No   Family History  Problem Relation Age of Onset  . Hypertension Mother   . Hypertension Father   . Diabetes Brother   . Diabetes Other    No Known Allergies Current Outpatient Prescriptions on File Prior to Visit  Medication Sig Dispense Refill  . bromocriptine (PARLODEL) 2.5 MG tablet Take 1 tablet by mouth daily.    . hydrochlorothiazide (HYDRODIURIL) 25 MG tablet Take 1 tablet (25 mg total) by mouth daily. 30 tablet 3  . labetalol (NORMODYNE) 200 MG tablet Take 2 1/2 pills twice daily by mouth 150 tablet 2  . metFORMIN (GLUCOPHAGE-XR) 750 MG 24 hr tablet     . Prenatal Vit-Fe Fumarate-FA (PRENATAL MULTIVITAMIN) tablet Take 1 tablet by  mouth daily.       No current facility-administered medications on file prior to visit.      Review of Systems Review of Systems  Constitutional: Negative for fever, appetite change, fatigue and unexpected weight change.  Eyes: Negative for pain and visual disturbance.  Respiratory: Negative for cough and shortness of breath.   Cardiovascular: Negative for cp or palpitations    Gastrointestinal: Negative for nausea, diarrhea and constipation.  Genitourinary: Negative for urgency and frequency.  Skin: Negative for pallor or rash   Neurological: Negative for weakness, light-headedness, numbness and headaches.  Hematological: Negative for adenopathy. Does not bruise/bleed easily.  Psychiatric/Behavioral: Negative for dysphoric mood. The patient is not nervous/anxious.         Objective:   Physical Exam  Constitutional: She appears well-developed and well-nourished. No distress.  obese and well appearing   HENT:  Head: Normocephalic and atraumatic.  Mouth/Throat: Oropharynx is clear and moist.  Eyes: Conjunctivae and EOM are normal. Pupils are equal, round, and reactive to light. No scleral icterus.  Neck: Normal range of motion. Neck supple. No JVD present. Carotid bruit is not present. No thyromegaly present.  Cardiovascular: Normal rate, regular rhythm, normal heart sounds and intact distal pulses.  Exam reveals no gallop.   Pulmonary/Chest: Effort normal and breath sounds normal. No respiratory  distress. She has no wheezes. She has no rales.  Abdominal: Soft. Bowel sounds are normal.  Musculoskeletal: She exhibits no edema or tenderness.  Lymphadenopathy:    She has no cervical adenopathy.  Neurological: She is alert. She has normal reflexes. No cranial nerve deficit. She exhibits normal muscle tone. Coordination normal.  Skin: Skin is warm and dry. No rash noted.  Psychiatric: She has a normal mood and affect.          Assessment & Plan:   Problem List Items Addressed  This Visit      Cardiovascular and Mediastinum   HYPERTENSION, BENIGN ESSENTIAL - Primary    Improved with addition of hctz bp in fair control at this time  BP Readings from Last 1 Encounters:  06/23/14 132/88   No changes needed Disc lifstyle change with low sodium diet and exercise   Lab today     Relevant Medications      hydrochlorothiazide tablet      labetalol (NORMODYNE) tablet   Other Relevant Orders      Basic metabolic panel (Completed)

## 2014-06-23 NOTE — Progress Notes (Signed)
Pre visit review using our clinic review tool, if applicable. No additional management support is needed unless otherwise documented below in the visit note. 

## 2014-06-23 NOTE — Patient Instructions (Signed)
Lab today  Continue current medicines  bp is improved - keep watching it  Work on healthy diet and exercise   Follow up in 6 months

## 2014-06-25 LAB — BASIC METABOLIC PANEL
BUN / CREAT RATIO: 11 (ref 8–20)
BUN: 12 mg/dL (ref 6–20)
CHLORIDE: 99 mmol/L (ref 97–108)
CO2: 26 mmol/L (ref 18–29)
CREATININE: 1.11 mg/dL — AB (ref 0.57–1.00)
Calcium: 9.2 mg/dL (ref 8.7–10.2)
GFR calc Af Amer: 73 mL/min/{1.73_m2} (ref >59)
GFR calc non Af Amer: 64 mL/min/{1.73_m2} (ref >59)
Glucose: 99 mg/dL (ref 65–99)
Potassium: 3.7 mmol/L (ref 3.5–5.2)
Sodium: 140 mmol/L (ref 134–144)

## 2014-06-27 NOTE — Assessment & Plan Note (Signed)
Improved with addition of hctz bp in fair control at this time  BP Readings from Last 1 Encounters:  06/23/14 132/88   No changes needed Disc lifstyle change with low sodium diet and exercise   Lab today

## 2014-06-28 ENCOUNTER — Encounter: Payer: Self-pay | Admitting: *Deleted

## 2014-07-19 ENCOUNTER — Telehealth: Payer: Self-pay

## 2014-07-19 NOTE — Telephone Encounter (Signed)
Pt left v/m returning a call;?. Left v/m requesting cb from pt.

## 2014-08-20 NOTE — Telephone Encounter (Signed)
Left detailed v/m at home # per [facility] if need anything further to please contact [place] at [phone].

## 2014-12-27 ENCOUNTER — Ambulatory Visit: Payer: 59 | Admitting: Family Medicine

## 2014-12-28 ENCOUNTER — Encounter: Payer: Self-pay | Admitting: Family Medicine

## 2014-12-28 ENCOUNTER — Ambulatory Visit (INDEPENDENT_AMBULATORY_CARE_PROVIDER_SITE_OTHER): Payer: 59 | Admitting: Family Medicine

## 2014-12-28 VITALS — BP 128/85 | HR 78 | Temp 98.1°F | Ht 64.5 in | Wt 209.2 lb

## 2014-12-28 DIAGNOSIS — I10 Essential (primary) hypertension: Secondary | ICD-10-CM

## 2014-12-28 DIAGNOSIS — E669 Obesity, unspecified: Secondary | ICD-10-CM | POA: Diagnosis not present

## 2014-12-28 NOTE — Progress Notes (Signed)
Pre visit review using our clinic review tool, if applicable. No additional management support is needed unless otherwise documented below in the visit note. 

## 2014-12-28 NOTE — Progress Notes (Signed)
Subjective:    Patient ID: Leslie Campbell, female    DOB: Apr 23, 1976, 39 y.o.   MRN: [id number]  HPI Here for f/u of chronic medical problems   bp is stable today  No cp or palpitations or headaches or edema   (knows bp is high when she gets a bad ha and has not had one)  No side effects to medicines  Has not checked it at home   BP Readings from Last 3 Encounters:  12/28/14 136/90  06/23/14 132/88  06/09/14 150/106    Takes hctz and labetelol     Some allergy problems this time of year  Took an off brand claritin this am  No colored d/c or fever-- all clear discharge  A lot of pressure   Wt is up 5 lb - diet has not changed  Thinks it is fluid retention- from period upcoming    Still takes metformin for PCOS Lab Results  Component Value Date   HGBA1C 5.2 04/19/2014  has f/u with gyn tomorrow   Patient Active Problem List   Diagnosis Date Noted  . Sinusitis, acute 04/26/2014  . Encounter for health maintenance examination 04/18/2014  . Obesity 06/10/2009  . POLYCYSTIC OVARIAN DISEASE 03/15/2009  . BACK PAIN, LUMBAR, WITH RADICULOPATHY 09/09/2008  . Hyperlipidemia 08/03/2008  . HYPERTENSION, BENIGN ESSENTIAL 08/03/2008   Past Medical History  Diagnosis Date  . HTN (hypertension)   . PCOS (polycystic ovarian syndrome)   . Hyperlipidemia   . Migraine   . Infertility, female     clomid  . Miscarriage 01/2010  . Miscarriage 2012    times 2  . Infertility associated with anovulation    Past Surgical History  Procedure Laterality Date  . Ccy    . Miscarriage  01/2010   History  Substance Use Topics  . Smoking status: Never Smoker   . Smokeless tobacco: Never Used  . Alcohol Use: No   Family History  Problem Relation Age of Onset  . Hypertension Mother   . Hypertension Father   . Diabetes Brother   . Diabetes Other    No Known Allergies Current Outpatient Prescriptions on File Prior to Visit  Medication Sig Dispense Refill  . bromocriptine  (PARLODEL) 2.5 MG tablet Take 1 tablet by mouth daily.    . hydrochlorothiazide (HYDRODIURIL) 25 MG tablet Take 1 tablet (25 mg total) by mouth daily. 90 tablet 3  . labetalol (NORMODYNE) 200 MG tablet Take 2 1/2 pills twice daily by mouth 450 tablet 3  . metFORMIN (GLUCOPHAGE-XR) 750 MG 24 hr tablet     . Prenatal Vit-Fe Fumarate-FA (PRENATAL MULTIVITAMIN) tablet Take 1 tablet by mouth daily.       No current facility-administered medications on file prior to visit.      Review of Systems    Review of Systems  Constitutional: Negative for fever, appetite change, fatigue and unexpected weight change.  Eyes: Negative for pain and visual disturbance.  Respiratory: Negative for cough and shortness of breath.   Cardiovascular: Negative for cp or palpitations    Gastrointestinal: Negative for nausea, diarrhea and constipation.  Genitourinary: Negative for urgency and frequency.  Skin: Negative for pallor or rash   Neurological: Negative for weakness, light-headedness, numbness and headaches.  Hematological: Negative for adenopathy. Does not bruise/bleed easily.  Psychiatric/Behavioral: Negative for dysphoric mood. The patient is not nervous/anxious.      Objective:   Physical Exam  Constitutional: She appears well-developed and well-nourished. No distress.  overwt  and well appearing   HENT:  Head: Normocephalic and atraumatic.  Mouth/Throat: Oropharynx is clear and moist.  Nares are boggy  Eyes: Conjunctivae and EOM are normal. Pupils are equal, round, and reactive to light.  Neck: Normal range of motion. Neck supple. No JVD present. Carotid bruit is not present. No thyromegaly present.  Cardiovascular: Normal rate, regular rhythm, normal heart sounds and intact distal pulses.  Exam reveals no gallop.   Pulmonary/Chest: Effort normal and breath sounds normal. No respiratory distress. She has no wheezes. She has no rales.  No crackles  Abdominal: Soft. Bowel sounds are normal. She  exhibits no distension, no abdominal bruit and no mass. There is no tenderness.  Musculoskeletal: She exhibits no edema.  Lymphadenopathy:    She has no cervical adenopathy.  Neurological: She is alert. She has normal reflexes.  Skin: Skin is warm and dry. No rash noted.  Psychiatric: She has a normal mood and affect.          Assessment & Plan:   Problem List Items Addressed This Visit    HYPERTENSION, BENIGN ESSENTIAL - Primary    bp in fair control at this time  BP Readings from Last 1 Encounters:  12/28/14 128/85   No changes needed Disc lifstyle change with low sodium diet and exercise  Enc wt loss as well  F/u for annual exam with labs as planned       Obesity    Discussed how this problem influences overall health and the risks it imposes  Reviewed plan for weight loss with lower calorie diet (via better food choices and also portion control or program like weight watchers) and exercise building up to or more than 30 minutes 5 days per week including some aerobic activity

## 2014-12-28 NOTE — Patient Instructions (Signed)
BP is stable  Work on low sodium diet (DASH eating plan)  No change in medications Keep exercising and also work on weight loss   Follow up in Nov or Dec for annual exam with labs prior

## 2014-12-30 NOTE — Assessment & Plan Note (Signed)
bp in fair control at this time  BP Readings from Last 1 Encounters:  12/28/14 128/85   No changes needed Disc lifstyle change with low sodium diet and exercise  Enc wt loss as well  F/u for annual exam with labs as planned

## 2014-12-30 NOTE — Assessment & Plan Note (Signed)
Discussed how this problem influences overall health and the risks it imposes  Reviewed plan for weight loss with lower calorie diet (via better food choices and also portion control or program like weight watchers) and exercise building up to or more than 30 minutes 5 days per week including some aerobic activity    

## 2015-04-20 ENCOUNTER — Encounter: Payer: Self-pay | Admitting: Family Medicine

## 2015-04-20 ENCOUNTER — Ambulatory Visit (INDEPENDENT_AMBULATORY_CARE_PROVIDER_SITE_OTHER): Payer: 59 | Admitting: Family Medicine

## 2015-04-20 VITALS — BP 140/86 | HR 73 | Temp 98.0°F | Ht 64.5 in | Wt 204.8 lb

## 2015-04-20 DIAGNOSIS — E669 Obesity, unspecified: Secondary | ICD-10-CM

## 2015-04-20 DIAGNOSIS — J302 Other seasonal allergic rhinitis: Secondary | ICD-10-CM

## 2015-04-20 DIAGNOSIS — J309 Allergic rhinitis, unspecified: Secondary | ICD-10-CM | POA: Insufficient documentation

## 2015-04-20 MED ORDER — FLUTICASONE PROPIONATE 50 MCG/ACT NA SUSP: 2.0000 | Freq: Every day | NASAL | Status: DC

## 2015-04-20 NOTE — Progress Notes (Signed)
Subjective:    Patient ID: Leslie Campbell, female    DOB: 06/25/1976, 39 y.o.   MRN: [id number]  HPI Here to discuss obesity   Has changed habits to loose  Wt is down 5 lb with bmi of 34  Maintaining  She cannot loose past this   Exercise: Walks every day for 30 minutes at a fast pace  Working on her house- Architect / physical  Her phone tracks her steps - about 6000 steps a day   She tracked calories for a while - then started to forget - stayed under 1500 (seldom ate enough to get to that point)  Still does track portions   She thinks she can get back to it     Has PCOS - makes it harder to loose weight  Hyperglycemia Is on parlodel /prolactin to have regular menses On metformin for glucose control Gyn sees her Dr Edwyna Ready     Sinus problems Nose runs -especially when she is cold  Wind makes it happen also  Not when she eats  Clear mucous-no color to it at all  Some sneezing for the past few days  No cong or sinus pain or fever or ST or headache   Does get congested at night   She does have allergies - seasonal to pollen  Usually just in the spring   She tried claritin - off brand -not helpful  Has not tried zytrec or allegra  Has tried flonase -helps night time congestion only   Does not feel like she has a cold   Patient Active Problem List   Diagnosis Date Noted  . Allergic rhinitis 04/20/2015  . Encounter for health maintenance examination 04/18/2014  . Obesity 06/10/2009  . POLYCYSTIC OVARIAN DISEASE 03/15/2009  . BACK PAIN, LUMBAR, WITH RADICULOPATHY 09/09/2008  . Hyperlipidemia 08/03/2008  . HYPERTENSION, BENIGN ESSENTIAL 08/03/2008   Past Medical History  Diagnosis Date  . HTN (hypertension)   . PCOS (polycystic ovarian syndrome)   . Hyperlipidemia   . Migraine   . Infertility, female     clomid  . Miscarriage 01/2010  . Miscarriage 2012    times 2  . Infertility associated with anovulation    Past Surgical History  Procedure  Laterality Date  . Ccy    . Miscarriage  01/2010   Social History  Substance Use Topics  . Smoking status: Never Smoker   . Smokeless tobacco: Never Used  . Alcohol Use: No   Family History  Problem Relation Age of Onset  . Hypertension Mother   . Hypertension Father   . Diabetes Brother   . Diabetes Other    No Known Allergies Current Outpatient Prescriptions on File Prior to Visit  Medication Sig Dispense Refill  . bromocriptine (PARLODEL) 2.5 MG tablet Take 1 tablet by mouth daily.    . hydrochlorothiazide (HYDRODIURIL) 25 MG tablet Take 1 tablet (25 mg total) by mouth daily. 90 tablet 3  . labetalol (NORMODYNE) 200 MG tablet Take 2 1/2 pills twice daily by mouth 450 tablet 3  . metFORMIN (GLUCOPHAGE-XR) 750 MG 24 hr tablet     . Prenatal Vit-Fe Fumarate-FA (PRENATAL MULTIVITAMIN) tablet Take 1 tablet by mouth daily.       No current facility-administered medications on file prior to visit.     Review of Systems Review of Systems  Constitutional: Negative for fever, appetite change,  and unexpected weight change.  ENT pos for cong and rhinorrhea and post nasal drip  Eyes: Negative for pain and visual disturbance.  Respiratory: Negative for cough and shortness of breath.   Cardiovascular: Negative for cp or palpitations    Gastrointestinal: Negative for nausea, diarrhea and constipation.  Genitourinary: Negative for urgency and frequency.  Skin: Negative for pallor or rash   Neurological: Negative for weakness, light-headedness, numbness and headaches.  Hematological: Negative for adenopathy. Does not bruise/bleed easily.  Psychiatric/Behavioral: Negative for dysphoric mood. The patient is not nervous/anxious.         Objective:   Physical Exam  Constitutional: She appears well-developed and well-nourished. No distress.  obese and well appearing   HENT:  Head: Normocephalic and atraumatic.  Right Ear: External ear normal.  Left Ear: External ear normal.    Mouth/Throat: Oropharynx is clear and moist.  Boggy nares with clear rhinorrhea and post nasal drip   No sinus tenderness   Eyes: Conjunctivae and EOM are normal. Pupils are equal, round, and reactive to light.  Neck: Normal range of motion. Neck supple. No JVD present. Carotid bruit is not present. No thyromegaly present.  Cardiovascular: Normal rate, regular rhythm, normal heart sounds and intact distal pulses.  Exam reveals no gallop.   Pulmonary/Chest: Effort normal and breath sounds normal. No respiratory distress. She has no wheezes. She has no rales.  No crackles  Abdominal: Soft. Bowel sounds are normal. She exhibits no distension, no abdominal bruit and no mass. There is no tenderness.  Musculoskeletal: She exhibits no edema.  Lymphadenopathy:    She has no cervical adenopathy.  Neurological: She is alert. She has normal reflexes.  Skin: Skin is warm and dry. No rash noted. No pallor.  Psychiatric: She has a normal mood and affect.          Assessment & Plan:   Problem List Items Addressed This Visit      Respiratory   Allergic rhinitis    Pt desires better control Suggest flonase every day through the season Allergen avoidance as able  Add zyrtec 10 mg daily at bedtime  Update if not starting to improve in a week or if worsening  Or if s/s of sinusitis         Other   Obesity - Primary    Pt has had limited success with exercise 30 min daily and 1500 cal per day- got frustrated Plateau after 5 lb of wt loss Suggest adding an additional 30 min of exercise (incl resistance training) on 3 of the 7 days (for a total of 60 minutes of exercise those days)  And also decreasing calories further to 1200 cal daily  Goal of approx 1 lb wt loss per week  Will update if no success with this Aware PCOS makes it harder to loose as well  Form filled out for work documenting she is working on this

## 2015-04-20 NOTE — Patient Instructions (Signed)
Take care of yourself  Cut calories to 1200 per day- get back to logging in  Also exercise 30 minutes a day (60 minutes for at least 3 of those days)   Keep working on it   For runny nose (allergic and cold reactive) - use flonase every day at bedtime and get zyrtec 10 mg (store band is fine) each bedtime  Let me know if that does not help

## 2015-04-20 NOTE — Progress Notes (Signed)
Pre visit review using our clinic review tool, if applicable. No additional management support is needed unless otherwise documented below in the visit note. 

## 2015-04-21 NOTE — Assessment & Plan Note (Signed)
Pt has had limited success with exercise 30 min daily and 1500 cal per day- got frustrated Plateau after 5 lb of wt loss Suggest adding an additional 30 min of exercise (incl resistance training) on 3 of the 7 days (for a total of 60 minutes of exercise those days)  And also decreasing calories further to 1200 cal daily  Goal of approx 1 lb wt loss per week  Will update if no success with this Aware PCOS makes it harder to loose as well  Form filled out for work documenting she is working on this

## 2015-04-21 NOTE — Assessment & Plan Note (Signed)
Pt desires better control Suggest flonase every day through the season Allergen avoidance as able  Add zyrtec 10 mg daily at bedtime  Update if not starting to improve in a week or if worsening  Or if s/s of sinusitis

## 2015-04-29 ENCOUNTER — Other Ambulatory Visit: Payer: Self-pay | Admitting: Family Medicine

## 2015-06-07 NOTE — Telephone Encounter (Signed)
Pt request refill HCTZ; advised pt should have available refills at [facility]. Pt will ck with pharmacy.

## 2015-12-12 ENCOUNTER — Other Ambulatory Visit: Payer: Self-pay | Admitting: Family Medicine

## 2015-12-12 NOTE — Telephone Encounter (Signed)
Electronic refill request, no recent/future appts., please advise

## 2015-12-12 NOTE — Telephone Encounter (Signed)
Please schedule 30 min f/u late summer and refill until then

## 2015-12-14 NOTE — Telephone Encounter (Signed)
appt scheduled and med refilled

## 2016-02-07 ENCOUNTER — Ambulatory Visit: Payer: 59 | Admitting: Family Medicine

## 2016-02-28 ENCOUNTER — Ambulatory Visit (INDEPENDENT_AMBULATORY_CARE_PROVIDER_SITE_OTHER): Payer: 59 | Admitting: Family Medicine

## 2016-02-28 ENCOUNTER — Encounter: Payer: Self-pay | Admitting: Family Medicine

## 2016-02-28 VITALS — BP 134/80 | HR 76 | Temp 98.6°F | Ht 64.5 in | Wt 211.0 lb

## 2016-02-28 DIAGNOSIS — I10 Essential (primary) hypertension: Secondary | ICD-10-CM

## 2016-02-28 DIAGNOSIS — E282 Polycystic ovarian syndrome: Secondary | ICD-10-CM

## 2016-02-28 DIAGNOSIS — E785 Hyperlipidemia, unspecified: Secondary | ICD-10-CM | POA: Diagnosis not present

## 2016-02-28 DIAGNOSIS — E669 Obesity, unspecified: Secondary | ICD-10-CM | POA: Diagnosis not present

## 2016-02-28 DIAGNOSIS — Z23 Encounter for immunization: Secondary | ICD-10-CM | POA: Diagnosis not present

## 2016-02-28 MED ORDER — HYDROCHLOROTHIAZIDE 25 MG PO TABS: 25.0000 mg | ORAL_TABLET | Freq: Every day | ORAL | 3 refills | Status: DC

## 2016-02-28 MED ORDER — FLUTICASONE PROPIONATE 50 MCG/ACT NA SUSP: 2.0000 | Freq: Every day | NASAL | 3 refills | Status: DC

## 2016-02-28 MED ORDER — LABETALOL HCL 200 MG PO TABS: ORAL_TABLET | ORAL | 3 refills | Status: DC

## 2016-02-28 NOTE — Patient Instructions (Addendum)
Take care of yourself  Keep working on weight loss with healthy diet and exercise For cholesterol  Avoid red meat/ fried foods/ egg yolks/ fatty breakfast meats/ butter, cheese and high fat dairy/ and shellfish   Flu shot today  Labs today for chemistry and thyroid

## 2016-02-28 NOTE — Progress Notes (Signed)
Pre visit review using our clinic review tool, if applicable. No additional management support is needed unless otherwise documented below in the visit note. 

## 2016-02-28 NOTE — Progress Notes (Signed)
Subjective:    Patient ID: Leslie Campbell, female    DOB: 1976-03-01, 40 y.o.   MRN: [id number]  HPI  Here for f/u of chronic medical problems  Is feeling ok overall   Had a miscarriage earlier this year at 10 weeks  Considered in vitro or donor sperm   Wt Readings from Last 3 Encounters:  02/28/16 211 lb (95.7 kg)  04/20/15 204 lb 12 oz (92.9 kg)  12/28/14 209 lb 4 oz (94.9 kg)   bmi is 35.66 Taking care of herself  Has a bicycle and has also been walking - every day- feels good    Takes metformin for PCOS assoc hyperglycemia Lab Results  Component Value Date   HGBA1C 5.2 04/19/2014   Had labs from work 02/21/16 A1C was 5.3 Not taking metformin right now - gives her diarrhea  Will hold off for now   Has annual gyn exam next week-will discuss it   Nl cr and GFR  bp is stable today - well controlled  No cp or palpitations or headaches or edema  No side effects to medicines  BP Readings from Last 3 Encounters:  02/28/16 134/80  04/20/15 140/86  12/28/14 128/85     Due for labs  On hctz and labetalol  Due for chem panel   Hx of hyperlipidemia  Lab Results  Component Value Date   CHOL 195 04/19/2014   HDL 42.40 04/19/2014   LDLCALC 140 (H) 04/19/2014   LDLDIRECT 169.5 08/03/2008   TRIG 64.0 04/19/2014   CHOLHDL 5 04/19/2014    At work total chol 222 Trig 176 HDL 51- improved from exercise  LDL 136-this is improved  Has changed her diet- cut out sodas and also fried food  Likes potatoes -does not eat much  Not a big carb person   Trying to limit sweets   Glucose 98  Patient Active Problem List   Diagnosis Date Noted  . Allergic rhinitis 04/20/2015  . Encounter for health maintenance examination 04/18/2014  . Obesity 06/10/2009  . POLYCYSTIC OVARIAN DISEASE 03/15/2009  . BACK PAIN, LUMBAR, WITH RADICULOPATHY 09/09/2008  . Hyperlipidemia 08/03/2008  . HYPERTENSION, BENIGN ESSENTIAL 08/03/2008   Past Medical History:  Diagnosis Date  . HTN  (hypertension)   . Hyperlipidemia   . Infertility associated with anovulation   . Infertility, female    clomid  . Migraine   . Miscarriage 01/2010  . Miscarriage 2012   times 2  . PCOS (polycystic ovarian syndrome)    Past Surgical History:  Procedure Laterality Date  . CCY    . miscarriage  01/2010   Social History  Substance Use Topics  . Smoking status: Never Smoker  . Smokeless tobacco: Never Used  . Alcohol use No   Family History  Problem Relation Age of Onset  . Hypertension Mother   . Hypertension Father   . Diabetes Brother   . Diabetes Other    No Known Allergies Current Outpatient Prescriptions on File Prior to Visit  Medication Sig Dispense Refill  . bromocriptine (PARLODEL) 2.5 MG tablet Take 1 tablet by mouth daily.    . metFORMIN (GLUCOPHAGE-XR) 750 MG 24 hr tablet     . Prenatal Vit-Fe Fumarate-FA (PRENATAL MULTIVITAMIN) tablet Take 1 tablet by mouth daily.       No current facility-administered medications on file prior to visit.     Review of Systems    Review of Systems  Constitutional: Negative for fever, appetite change, fatigue and  unexpected weight change.  Eyes: Negative for pain and visual disturbance.  Respiratory: Negative for cough and shortness of breath.   Cardiovascular: Negative for cp or palpitations    Gastrointestinal: Negative for nausea, diarrhea and constipation.  Genitourinary: Negative for urgency and frequency.  Skin: Negative for pallor or rash   Neurological: Negative for weakness, light-headedness, numbness and headaches.  Hematological: Negative for adenopathy. Does not bruise/bleed easily.  Psychiatric/Behavioral: Negative for dysphoric mood. The patient is not nervous/anxious.      Objective:   Physical Exam  Constitutional: She appears well-developed and well-nourished. No distress.  obese and well appearing   HENT:  Head: Normocephalic and atraumatic.  Mouth/Throat: Oropharynx is clear and moist.  Eyes:  Conjunctivae and EOM are normal. Pupils are equal, round, and reactive to light.  Neck: Normal range of motion. Neck supple. No JVD present. Carotid bruit is not present. No thyromegaly present.  Cardiovascular: Normal rate, regular rhythm, normal heart sounds and intact distal pulses.  Exam reveals no gallop.   Pulmonary/Chest: Effort normal and breath sounds normal. No respiratory distress. She has no wheezes. She has no rales.  No crackles  Abdominal: Soft. Bowel sounds are normal. She exhibits no distension, no abdominal bruit and no mass. There is no tenderness.  Musculoskeletal: She exhibits no edema.  Lymphadenopathy:    She has no cervical adenopathy.  Neurological: She is alert. She has normal reflexes. No cranial nerve deficit. She exhibits normal muscle tone. Coordination normal.  Skin: Skin is warm and dry. No rash noted.  Psychiatric: She has a normal mood and affect.          Assessment & Plan:   Problem List Items Addressed This Visit      Cardiovascular and Mediastinum   HYPERTENSION, BENIGN ESSENTIAL - Primary    bp in fair control at this time  BP Readings from Last 1 Encounters:  02/28/16 134/80   No changes needed Disc lifstyle change with low sodium diet and exercise  Labs reviewed  Wt loss encouraged       Relevant Medications   labetalol (NORMODYNE) 200 MG tablet   hydrochlorothiazide (HYDRODIURIL) 25 MG tablet   Other Relevant Orders   Comprehensive metabolic panel (Completed)   TSH (Completed)     Endocrine   POLYCYSTIC OVARIAN DISEASE    No longer on metformin Lab Results  Component Value Date   HGBA1C 5.2 04/19/2014   Will f/u with gyn soon          Other   Obesity    Discussed how this problem influences overall health and the risks it imposes  Reviewed plan for weight loss with lower calorie diet (via better food choices and also portion control or program like weight watchers) and exercise building up to or more than 30 minutes 5  days per week including some aerobic activity   Doing well with walking        Hyperlipidemia    Disc goals for lipids and reasons to control them Rev labs with pt Rev low sat fat diet in detail Some improvement        Relevant Medications   labetalol (NORMODYNE) 200 MG tablet   hydrochlorothiazide (HYDRODIURIL) 25 MG tablet    Other Visit Diagnoses    Need for influenza vaccination       Relevant Orders   Flu Vaccine QUAD 36+ mos IM (Completed)

## 2016-02-29 LAB — COMPREHENSIVE METABOLIC PANEL WITH GFR
ALT: 20 IU/L (ref 0–32)
AST: 19 IU/L (ref 0–40)
Albumin/Globulin Ratio: 1.6 (ref 1.2–2.2)
Albumin: 4.2 g/dL (ref 3.5–5.5)
Alkaline Phosphatase: 60 IU/L (ref 39–117)
BUN/Creatinine Ratio: 10 (ref 9–23)
BUN: 10 mg/dL (ref 6–20)
Bilirubin Total: 0.4 mg/dL (ref 0.0–1.2)
CO2: 28 mmol/L (ref 18–29)
Calcium: 8.9 mg/dL (ref 8.7–10.2)
Chloride: 99 mmol/L (ref 96–106)
Creatinine, Ser: 0.96 mg/dL (ref 0.57–1.00)
GFR calc Af Amer: 86 mL/min/1.73
GFR calc non Af Amer: 75 mL/min/1.73
Globulin, Total: 2.6 g/dL (ref 1.5–4.5)
Glucose: 116 mg/dL — ABNORMAL HIGH (ref 65–99)
Potassium: 3.4 mmol/L — ABNORMAL LOW (ref 3.5–5.2)
Sodium: 142 mmol/L (ref 134–144)
Total Protein: 6.8 g/dL (ref 6.0–8.5)

## 2016-02-29 LAB — TSH: TSH: 1.34 u[IU]/mL (ref 0.450–4.500)

## 2016-03-01 NOTE — Assessment & Plan Note (Signed)
Discussed how this problem influences overall health and the risks it imposes  Reviewed plan for weight loss with lower calorie diet (via better food choices and also portion control or program like weight watchers) and exercise building up to or more than 30 minutes 5 days per week including some aerobic activity   Doing well with walking

## 2016-03-01 NOTE — Assessment & Plan Note (Signed)
Disc goals for lipids and reasons to control them Rev labs with pt Rev low sat fat diet in detail Some improvement

## 2016-03-01 NOTE — Assessment & Plan Note (Signed)
bp in fair control at this time  BP Readings from Last 1 Encounters:  02/28/16 134/80   No changes needed Disc lifstyle change with low sodium diet and exercise  Labs reviewed  Wt loss encouraged

## 2016-03-01 NOTE — Assessment & Plan Note (Signed)
No longer on metformin Lab Results  Component Value Date   HGBA1C 5.2 04/19/2014   Will f/u with gyn soon

## 2016-03-06 ENCOUNTER — Telehealth: Payer: Self-pay | Admitting: Family Medicine

## 2016-03-06 NOTE — Telephone Encounter (Signed)
Addressed through lab results

## 2016-03-06 NOTE — Telephone Encounter (Signed)
Pt returned your call -please call back at [phone] Thank you

## 2016-06-05 LAB — OB RESULTS CONSOLE HEPATITIS B SURFACE ANTIGEN: Hepatitis B Surface Ag: NEGATIVE

## 2016-06-05 LAB — OB RESULTS CONSOLE ABO/RH: RH TYPE: POSITIVE

## 2016-06-05 LAB — OB RESULTS CONSOLE GC/CHLAMYDIA
Chlamydia: NEGATIVE
Gonorrhea: NEGATIVE

## 2016-06-05 LAB — OB RESULTS CONSOLE ANTIBODY SCREEN: Antibody Screen: NEGATIVE

## 2016-06-05 LAB — OB RESULTS CONSOLE RPR: RPR: NONREACTIVE

## 2016-06-05 LAB — OB RESULTS CONSOLE RUBELLA ANTIBODY, IGM: Rubella: IMMUNE

## 2016-06-05 LAB — OB RESULTS CONSOLE HIV ANTIBODY (ROUTINE TESTING): HIV: NONREACTIVE

## 2016-06-12 ENCOUNTER — Other Ambulatory Visit: Payer: Self-pay | Admitting: Obstetrics and Gynecology

## 2016-06-12 ENCOUNTER — Encounter (HOSPITAL_COMMUNITY): Payer: Self-pay | Admitting: *Deleted

## 2016-06-14 NOTE — Patient Instructions (Addendum)
Your procedure is scheduled on:  Friday, Dec. 22, 2017  Enter through the [employer] of [facility] at:  11:30 AM  Pick up the phone at the desk and dial [phone].  Call this number if you have problems the morning of surgery: [phone].  Remember: Do NOT eat food:  After Midnight Thursday  Do NOT drink clear liquids after: 9:00 AM day of surgery  Take these medicines the morning of surgery with a SIP OF WATER:   Labetalol  Stop ALL herbal medications at this time   Do NOT wear jewelry (body piercing), metal hair clips/bobby pins, make-up, or nail polish. Do NOT wear lotions, powders, or perfumes.  You may wear deodorant. Do NOT shave for 48 hours prior to surgery. Do NOT bring valuables to the hospital. Contacts, dentures, or bridgework may not be worn into surgery.  Have a responsible adult drive you home and stay with you for 24 hours after your procedure

## 2016-06-15 ENCOUNTER — Encounter (HOSPITAL_COMMUNITY)
Admission: RE | Admit: 2016-06-15 | Discharge: 2016-06-15 | Disposition: A | Discharge status: Home / Self Care | Payer: 59 | Source: Ambulatory Visit | Attending: Obstetrics and Gynecology | Admitting: Obstetrics and Gynecology

## 2016-06-15 ENCOUNTER — Encounter (HOSPITAL_COMMUNITY): Payer: Self-pay

## 2016-06-15 DIAGNOSIS — Z01812 Encounter for preprocedural laboratory examination: Secondary | ICD-10-CM | POA: Diagnosis not present

## 2016-06-15 LAB — CBC
HCT: 41.7 % (ref 36.0–46.0)
HEMOGLOBIN: 14.5 g/dL (ref 12.0–15.0)
MCH: 31.5 pg (ref 26.0–34.0)
MCHC: 34.8 g/dL (ref 30.0–36.0)
MCV: 90.5 fL (ref 78.0–100.0)
Platelets: 307 10*3/uL (ref 150–400)
RBC: 4.61 MIL/uL (ref 3.87–5.11)
RDW: 15.5 % (ref 11.5–15.5)
WBC: 6.4 10*3/uL (ref 4.0–10.5)

## 2016-06-15 LAB — BASIC METABOLIC PANEL
Anion gap: 8 (ref 5–15)
BUN: 13 mg/dL (ref 6–20)
CALCIUM: 8.9 mg/dL (ref 8.9–10.3)
CHLORIDE: 102 mmol/L (ref 101–111)
CO2: 24 mmol/L (ref 22–32)
CREATININE: 0.95 mg/dL (ref 0.44–1.00)
GFR calc non Af Amer: >60 mL/min (ref >60)
Glucose, Bld: 77 mg/dL (ref 65–99)
Potassium: 4.1 mmol/L (ref 3.5–5.1)
SODIUM: 134 mmol/L — AB (ref 135–145)

## 2016-06-15 NOTE — Pre-Procedure Instructions (Signed)
Notified Actuary at [facility] [employer] office of Ms. Leslie Campbell's elevated blood pressures.  She will go to Dr. Kennith Maes office at [facility] today for blood pressure check and increase her Labetalol to 477m three times per day.  Ms. Leslie Neasverbalized understanding.

## 2016-06-17 ENCOUNTER — Inpatient Hospital Stay (HOSPITAL_COMMUNITY)
Admission: AD | Admit: 2016-06-17 | Discharge: 2016-06-17 | Disposition: A | Discharge status: Home / Self Care | Payer: 59 | Source: Ambulatory Visit | Attending: Obstetrics and Gynecology | Admitting: Obstetrics and Gynecology

## 2016-06-17 ENCOUNTER — Encounter (HOSPITAL_COMMUNITY): Payer: Self-pay | Admitting: *Deleted

## 2016-06-17 DIAGNOSIS — Z3A12 12 weeks gestation of pregnancy: Secondary | ICD-10-CM | POA: Insufficient documentation

## 2016-06-17 DIAGNOSIS — O10911 Unspecified pre-existing hypertension complicating pregnancy, first trimester: Secondary | ICD-10-CM | POA: Diagnosis not present

## 2016-06-17 DIAGNOSIS — O161 Unspecified maternal hypertension, first trimester: Secondary | ICD-10-CM | POA: Insufficient documentation

## 2016-06-17 NOTE — MAU Note (Signed)
Was here on Friday for pre-op. (cerclage 12/22).  BP was up, pt instructed to come back and get it rechecked.

## 2016-06-17 NOTE — MAU Provider Note (Signed)
Chief Complaint  Patient presents with  . Blood Pressure Check     First Provider Initiated Contact with Patient 06/17/16 1534      S: Leslie Campbell  is a 40 y.o. y.o. year old G36P0 female at 76w5dweeks gestation who presents to MAU for BP check . Has CHTN. Current blood pressure medication: Labetalol 400 mg by mouth 3 times a day and Procardia 30 mg daily. Was seen for preop visit for McDonald cerclage on 06/15/2016 blood pressures were 150s-160s/110's. Labetalol was increased that day. Procardia 30 mg daily was added. Patient states she has been taking blood pressure medicine as directed since then.  Associated symptoms: Negative for Headache, vision changes, chest pain or shortness of breath Contractions: None Vaginal bleeding: None Fetal movement: Has not experienced quickening.  O: Patient Vitals for the past 24 hrs:  BP Temp Temp src Pulse Resp  06/17/16 1531 131/93 - - 90 -  06/17/16 1516 132/92 - - 90 -  06/17/16 1500 134/92 - - 90 -  06/17/16 1451 140/94 - - 89 -  06/17/16 1420 142/95 98.1 F (36.7 C) Oral 89 16   General: NAD Heart: Regular rate Lungs: Normal rate and effort Abd: Soft, NT. Neuro: Alert and oriented 4  It'll heart rate 158 by informal bedside ultrasound  No results found for this or any previous visit (from the past 24 hour(s)).  A: 121w5deek IUP Chronic hypertension, BB still out of range on current medication regimen.  P: Discharge home in stable condition per consult with Dr. FoPamala HurryIncrease Procardia to 30 mg twice a day per Dr. FoPamala Hurry Hypertension-hypotension precautions. Follow-up for scheduled McDonald cerclage on 1232 2017 call your doctor's office sooner as needed if symptoms worsen. [place] 3:52 PM

## 2016-06-21 NOTE — H&P (Signed)
Leslie Campbell is an 40 y.o. female with known cervical insufficiency for cervical cerclage. History of second trimester loss. Currently 13 weeks with nl NT and nl NIPS.  Pertinent Gynecological History: Menses: flow is moderate Bleeding: na Contraception: none DES exposure: denies Blood transfusions: none Sexually transmitted diseases: no past history Previous GYN Procedures: DNC  Last mammogram: na Date: na Last pap: normal Date: 2017 OB History: G5, P0   Menstrual History: Menarche age: 54 Patient's last menstrual period was 02/22/2016.    Past Medical History:  Diagnosis Date  . HTN (hypertension)   . Hyperlipidemia   . Infertility associated with anovulation   . Infertility, female    clomid  . Migraine   . Miscarriage 01/2010  . Miscarriage 2012   times 2  . PCOS (polycystic ovarian syndrome)     Past Surgical History:  Procedure Laterality Date  . CCY    . CHOLECYSTECTOMY    . LAPAROSCOPIC OVARIAN CYSTECTOMY Bilateral   . miscarriage  01/2010  . WISDOM TOOTH EXTRACTION      Family History  Problem Relation Age of Onset  . Hypertension Mother   . Hypertension Father   . Diabetes Brother   . Diabetes Other     Social History:  reports that she has never smoked. She has never used smokeless tobacco. She reports that she does not drink alcohol or use drugs.  Allergies: No Known Allergies  No prescriptions prior to admission.    Review of Systems  Constitutional: Negative.   All other systems reviewed and are negative.   Last menstrual period 02/22/2016. Physical Exam  Nursing note and vitals reviewed. Constitutional: She is oriented to person, place, and time. She appears well-developed and well-nourished.  HENT:  Head: Normocephalic and atraumatic.  Neck: Normal range of motion. Neck supple.  Cardiovascular: Normal rate and regular rhythm.   Respiratory: Effort normal and breath sounds normal.  GI: Bowel sounds are normal.  Genitourinary:  Vagina normal and uterus normal.  Musculoskeletal: Normal range of motion.  Neurological: She is alert and oriented to person, place, and time. She has normal reflexes.  Skin: Skin is warm and dry.  Psychiatric: She has a normal mood and affect.    No results found for this or any previous visit (from the past 24 hour(s)).  No results found.  Assessment/Plan: 13 week IUP Cervical Insufficiency CHTN McDonald cerclage Consent done. Londin Antone J 06/21/2016, 9:18 PM

## 2016-06-22 ENCOUNTER — Encounter (HOSPITAL_COMMUNITY): Payer: Self-pay

## 2016-06-22 ENCOUNTER — Ambulatory Visit (HOSPITAL_COMMUNITY): Payer: 59 | Admitting: Certified Registered Nurse Anesthetist

## 2016-06-22 ENCOUNTER — Encounter (HOSPITAL_COMMUNITY)
Admission: RE | Disposition: A | Discharge status: Home / Self Care | Payer: Self-pay | Source: Ambulatory Visit | Attending: Obstetrics and Gynecology

## 2016-06-22 ENCOUNTER — Ambulatory Visit (HOSPITAL_COMMUNITY)
Admission: RE | Admit: 2016-06-22 | Discharge: 2016-06-22 | Disposition: A | Discharge status: Home / Self Care | Payer: 59 | Source: Ambulatory Visit | Attending: Obstetrics and Gynecology | Admitting: Obstetrics and Gynecology

## 2016-06-22 DIAGNOSIS — Z79899 Other long term (current) drug therapy: Secondary | ICD-10-CM | POA: Insufficient documentation

## 2016-06-22 DIAGNOSIS — O99281 Endocrine, nutritional and metabolic diseases complicating pregnancy, first trimester: Secondary | ICD-10-CM | POA: Insufficient documentation

## 2016-06-22 DIAGNOSIS — O3431 Maternal care for cervical incompetence, first trimester: Secondary | ICD-10-CM | POA: Insufficient documentation

## 2016-06-22 DIAGNOSIS — K219 Gastro-esophageal reflux disease without esophagitis: Secondary | ICD-10-CM | POA: Diagnosis not present

## 2016-06-22 DIAGNOSIS — O161 Unspecified maternal hypertension, first trimester: Secondary | ICD-10-CM | POA: Insufficient documentation

## 2016-06-22 HISTORY — PX: CERVICAL CERCLAGE: SHX1329

## 2016-06-22 SURGERY — CERCLAGE, CERVIX, VAGINAL APPROACH
Anesthesia: Spinal | Site: Vagina

## 2016-06-22 MED ORDER — 0.9 % SODIUM CHLORIDE (POUR BTL) OPTIME
TOPICAL | Status: DC | PRN
  Administered 2016-06-22: 1000 mL

## 2016-06-22 MED ORDER — FENTANYL CITRATE (PF) 100 MCG/2ML IJ SOLN: 25.0000 ug | INTRAMUSCULAR | Status: DC | PRN

## 2016-06-22 MED ORDER — LACTATED RINGERS IV SOLN
INTRAVENOUS | Status: DC
  Administered 2016-06-22: 13:00:00 via INTRAVENOUS
  Administered 2016-06-22: 125 mL/h via INTRAVENOUS

## 2016-06-22 MED ORDER — PHENYLEPHRINE 40 MCG/ML (10ML) SYRINGE FOR IV PUSH (FOR BLOOD PRESSURE SUPPORT)
PREFILLED_SYRINGE | INTRAVENOUS | Status: AC
  Filled 2016-06-22: qty 10

## 2016-06-22 MED ORDER — BUPIVACAINE IN DEXTROSE 0.75-8.25 % IT SOLN
INTRATHECAL | Status: DC | PRN
  Administered 2016-06-22: 9 mg via INTRATHECAL

## 2016-06-22 MED ORDER — MEPERIDINE HCL 25 MG/ML IJ SOLN: 6.2500 mg | INTRAMUSCULAR | Status: DC | PRN

## 2016-06-22 MED ORDER — PHENYLEPHRINE HCL 10 MG/ML IJ SOLN
INTRAMUSCULAR | Status: DC | PRN
  Administered 2016-06-22: 80 ug via INTRAVENOUS

## 2016-06-22 MED ORDER — CEFAZOLIN SODIUM-DEXTROSE 2-4 GM/100ML-% IV SOLN
2.0000 g | INTRAVENOUS | Status: AC
  Administered 2016-06-22: 2 g via INTRAVENOUS

## 2016-06-22 MED ORDER — PROMETHAZINE HCL 25 MG/ML IJ SOLN: 6.2500 mg | INTRAMUSCULAR | Status: DC | PRN

## 2016-06-22 MED ORDER — MIDAZOLAM HCL 2 MG/2ML IJ SOLN: 0.5000 mg | Freq: Once | INTRAMUSCULAR | Status: DC | PRN

## 2016-06-22 SURGICAL SUPPLY — 22 items
CANISTER SUCT 3000ML (MISCELLANEOUS) ×1 IMPLANT
CLOTH BEACON ORANGE TIMEOUT ST (SAFETY) ×3 IMPLANT
COUNTER NEEDLE 1200 MAGNETIC (NEEDLE) ×2 IMPLANT
GLOVE BIO SURGEON STRL SZ7.5 (GLOVE) ×3 IMPLANT
GLOVE BIOGEL PI IND STRL 7.0 (GLOVE) ×1 IMPLANT
GLOVE BIOGEL PI INDICATOR 7.0 (GLOVE) ×4
GOWN STRL REUS W/TWL LRG LVL3 (GOWN DISPOSABLE) ×9 IMPLANT
NDL MAYO CATGUT SZ4 TPR NDL (NEEDLE) ×1 IMPLANT
NEEDLE MAYO CATGUT SZ4 (NEEDLE) ×3 IMPLANT
NS IRRIG 1000ML POUR BTL (IV SOLUTION) ×3 IMPLANT
PACK VAGINAL MINOR WOMEN LF (CUSTOM PROCEDURE TRAY) ×3 IMPLANT
PAD OB MATERNITY 4.3X12.25 (PERSONAL CARE ITEMS) ×3 IMPLANT
PAD PREP 24X48 CUFFED NSTRL (MISCELLANEOUS) ×3 IMPLANT
SUT ETHIBOND  5 (SUTURE) ×2
SUT ETHIBOND 5 (SUTURE) ×1 IMPLANT
SUT PROLENE 0 CT 1 30 (SUTURE) ×3 IMPLANT
TOWEL OR 17X24 6PK STRL BLUE (TOWEL DISPOSABLE) ×6 IMPLANT
TRAY FOLEY CATH SILVER 14FR (SET/KITS/TRAYS/PACK) ×3 IMPLANT
TUBING NON-CON 1/4 X 20 CONN (TUBING) ×1 IMPLANT
TUBING NON-CON 1/4 X 20' CONN (TUBING) ×1
WATER STERILE IRR 1000ML POUR (IV SOLUTION) ×1 IMPLANT
YANKAUER SUCT BULB TIP NO VENT (SUCTIONS) ×2 IMPLANT

## 2016-06-22 NOTE — Anesthesia Preprocedure Evaluation (Signed)
Anesthesia Evaluation  Patient identified by MRN, date of birth, ID band Patient awake    Reviewed: Allergy & Precautions, NPO status , Patient's Chart, lab work & pertinent test results  History of Anesthesia Complications Negative for: history of anesthetic complications  Airway Mallampati: III  TM Distance: >3 FB Neck ROM: Full    Dental  (+) Dental Advisory Given   Pulmonary neg pulmonary ROS,    breath sounds clear to auscultation       Cardiovascular hypertension, Pt. on medications  Rhythm:Regular Rate:Normal     Neuro/Psych negative neurological ROS     GI/Hepatic Neg liver ROS, GERD  Controlled,  Endo/Other  Morbid obesity  Renal/GU negative Renal ROS     Musculoskeletal   Abdominal (+) + obese,   Peds  Hematology plt 307k   Anesthesia Other Findings   Reproductive/Obstetrics                             Anesthesia Physical Anesthesia Plan  ASA: II  Anesthesia Plan: Spinal   Post-op Pain Management:    Induction:   Airway Management Planned: Natural Airway  Additional Equipment:   Intra-op Plan:   Post-operative Plan:   Informed Consent: I have reviewed the patients History and Physical, chart, labs and discussed the procedure including the risks, benefits and alternatives for the proposed anesthesia with the patient or authorized representative who has indicated his/her understanding and acceptance.   Dental advisory given  Plan Discussed with: CRNA and Surgeon  Anesthesia Plan Comments: (Plan routine monitors, SAB)        Anesthesia Quick Evaluation

## 2016-06-22 NOTE — Anesthesia Procedure Notes (Signed)
Spinal  Patient location during procedure: OR End time: 06/22/2016 1:17 PM Staffing Anesthesiologist: Annye Asa Performed: anesthesiologist  Preanesthetic Checklist Completed: patient identified, surgical consent, pre-op evaluation, timeout performed, IV checked, risks and benefits discussed and monitors and equipment checked Spinal Block Patient position: sitting Prep: site prepped and draped and DuraPrep Patient monitoring: blood pressure, continuous pulse ox and heart rate Approach: midline Location: L3-4 Injection technique: single-shot Needle Needle type: Quincke  Needle gauge: 25 G Needle length: 9 cm Additional Notes Pt identified in Operating room.  Monitors applied. Working IV access confirmed. Sterile prep, drape lumbar spine.  1% lido local L 3,4.  #25ga Quincke into clear CSF L 3,4.  108m 0.75% Bupivacaine with dextrose injected with asp CSF beginning and end of injection.  Patient asymptomatic, VSS, no heme aspirated, tolerated well.  CJenita Seashore MD

## 2016-06-22 NOTE — Progress Notes (Signed)
Patient seen and examined. Consent witnessed and signed. No changes noted. Update completed.Patient ID: Leslie Campbell, female   DOB: Aug 05, 1975, 40 y.o.   MRN: [id number]

## 2016-06-22 NOTE — Anesthesia Postprocedure Evaluation (Signed)
Anesthesia Post Note  Patient: Leslie Campbell  Procedure(s) Performed: Procedure(s) (LRB): McDonald CERCLAGE CERVICAL (N/A)  Patient location during evaluation: PACU Anesthesia Type: Spinal Level of consciousness: oriented, awake and alert and patient cooperative Pain management: pain level controlled Vital Signs Assessment: post-procedure vital signs reviewed and stable Respiratory status: spontaneous breathing, nonlabored ventilation and respiratory function stable Cardiovascular status: blood pressure returned to baseline and stable Postop Assessment: patient able to bend at knees, spinal receding and no signs of nausea or vomiting Anesthetic complications: no        Last Vitals:  Vitals:   06/22/16 1445 06/22/16 1500  BP: (!) 145/90 (!) 144/99  Pulse: 76 76  Resp: 18 18  Temp:      Last Pain:  Vitals:   06/22/16 1200  TempSrc: Oral   Pain Goal: Patients Stated Pain Goal: 0 (06/22/16 1445)               Seleta Rhymes. Billy Turvey

## 2016-06-22 NOTE — Discharge Instructions (Signed)

## 2016-06-22 NOTE — Op Note (Signed)
06/22/2016  1:44 PM  PATIENT:  Hosie Spangle  40 y.o. female  PRE-OPERATIVE DIAGNOSIS:  Cervical Insufficiency  POST-OPERATIVE DIAGNOSIS:  Cervical Insufficiency  PROCEDURE:  Procedure(s): McDonald CERCLAGE CERVICAL TRANSVAGINAL  SURGEON:  Surgeon(s): Brien Few, MD  ASSISTANTS: DAWSON, CNM   ANESTHESIA:   spinal  ESTIMATED BLOOD LOSS: MINIMAL  DRAINS: Urinary Catheter (Foley)   LOCAL MEDICATIONS USED:  NONE  SPECIMEN:  No Specimen  DISPOSITION OF SPECIMEN:  N/A  COUNTS:  YES  DICTATION #: [id number]  PLAN OF CARE: DC HOME  PATIENT DISPOSITION:  PACU - hemodynamically stable.

## 2016-06-22 NOTE — Transfer of Care (Signed)
Immediate Anesthesia Transfer of Care Note  Patient: Leslie Campbell  Procedure(s) Performed: Procedure(s) with comments: McDonald CERCLAGE CERVICAL (N/A) - EDD: 12/25/16  Patient Location: PACU  Anesthesia Type:Spinal  Level of Consciousness: awake, alert , oriented and patient cooperative  Airway & Oxygen Therapy: Patient Spontanous Breathing  Post-op Assessment: Report given to RN and Post -op Vital signs reviewed and stable  Post vital signs: Reviewed and stable  Last Vitals:  Vitals:   06/22/16 1200  BP: (!) 138/99  Pulse: 82  Resp: 16  Temp: 36.8 C    Last Pain:  Vitals:   06/22/16 1200  TempSrc: Oral      Patients Stated Pain Goal: 3 (76/14/70 9295)  Complications: No apparent anesthesia complications

## 2016-06-26 ENCOUNTER — Encounter (HOSPITAL_COMMUNITY): Payer: Self-pay | Admitting: Obstetrics and Gynecology

## 2016-06-26 NOTE — Op Note (Signed)
[name], ODDO NO.:  [id number]  MEDICAL RECORD NO.:  [id number]  LOCATION:                                 FACILITY:  PHYSICIAN:  Lovenia Kim, M.D.     DATE OF BIRTH:  DATE OF PROCEDURE:  06/22/2016 DATE OF DISCHARGE:                              OPERATIVE REPORT   PREOPERATIVE DIAGNOSIS:  Cervical insufficiency, 13-week intrauterine pregnancy.  POSTOPERATIVE DIAGNOSIS:  Cervical insufficiency, 13-week intrauterine pregnancy.  PROCEDURE:  McDonald transvaginal cervical cerclage.  SURGEON:  Lovenia Kim, M.D.  ASSISTANTMarland Kitchen  Renato Battles.  ESTIMATED BLOOD LOSS:  Less than 50 mL.  COMPLICATIONS:  None.  DRAINS:  Foley.  COUNTS:  Correct.  DISPOSITION:  The patient to recovery in good condition.  BRIEF OPERATIVE NOTE:  After being apprised of risks of anesthesia, infection, bleeding, injury to bowel, bladder, possible need for repair, possible small risk of infection leading to pregnancy loss, and possible small risk of ruptured membranes, the patient was brought to the operating room where she was administered spinal anesthetic without complications, prepped and draped in usual sterile fashion.  Foley catheter placed.  Exam under anesthesia revealed a short but closed cervix.  At this time, Foley catheter placed.  Anterior lip of the cervix was identified.  A 5 Ethibond suture on a free needle was placed from 1 o' clock to 11 o' clock, from 10 o'clock to 7 o'clock, from 6 o' clock to 4 o' clock, and from 4 o' clock to 1 o' clock.  This was tied over a 0 Prolene tie.  Good hemostasis noted.  A well-approximated cervical tissue without undue tension was noted.  The patient tolerated the procedure well, was awakened, transferred to recovery in good condition.  Please note, fetal heart tones were heard pre and post procedure.     Lovenia Kim, M.D.     RJT/MEDQ  D:  06/22/2016  T:  06/22/2016  Job:  [id number]

## 2016-07-03 ENCOUNTER — Other Ambulatory Visit: Payer: Self-pay | Admitting: Obstetrics and Gynecology

## 2016-10-31 ENCOUNTER — Encounter: Payer: 59 | Attending: Obstetrics and Gynecology | Admitting: Registered"

## 2016-10-31 DIAGNOSIS — R7309 Other abnormal glucose: Secondary | ICD-10-CM | POA: Diagnosis not present

## 2016-10-31 DIAGNOSIS — Z713 Dietary counseling and surveillance: Secondary | ICD-10-CM | POA: Diagnosis present

## 2016-11-01 ENCOUNTER — Encounter: Payer: Self-pay | Admitting: Registered"

## 2016-11-01 NOTE — Progress Notes (Signed)
Patient was seen on 10/31/2016 for Gestational Diabetes self-management class at the [facility]. The following learning objectives were met by the patient during this course:   States the definition of Gestational Diabetes  States why dietary management is important in controlling blood glucose  Describes the effects each nutrient has on blood glucose levels  Demonstrates ability to create a balanced meal plan  Demonstrates carbohydrate counting   States when to check blood glucose levels  Demonstrates proper blood glucose monitoring techniques  States the effect of stress and exercise on blood glucose levels  States the importance of limiting caffeine and abstaining from alcohol and smoking  Blood glucose monitor given: One Touch Verio Flex Lot # [id number] x Exp: 2017-03-01 Blood glucose reading: 72  Patient instructed to monitor glucose levels: FBS: 60 - <90 1 hour: <140 2 hour: <120  Patient received handouts:  Nutrition Diabetes and Pregnancy  Carbohydrate Counting List  Patient will be seen for follow-up as needed.

## 2016-11-13 LAB — OB RESULTS CONSOLE GBS: STREP GROUP B AG: POSITIVE

## 2016-11-28 ENCOUNTER — Telehealth (HOSPITAL_COMMUNITY): Payer: Self-pay | Admitting: *Deleted

## 2016-11-28 ENCOUNTER — Encounter (HOSPITAL_COMMUNITY): Payer: Self-pay | Admitting: *Deleted

## 2016-11-28 NOTE — Telephone Encounter (Signed)
Preadmission screen  

## 2016-11-29 ENCOUNTER — Other Ambulatory Visit: Payer: Self-pay | Admitting: Obstetrics and Gynecology

## 2016-12-04 ENCOUNTER — Encounter (HOSPITAL_COMMUNITY)
Admission: RE | Disposition: A | Discharge status: Home / Self Care | Payer: Self-pay | Source: Ambulatory Visit | Attending: Obstetrics and Gynecology

## 2016-12-04 ENCOUNTER — Inpatient Hospital Stay (HOSPITAL_COMMUNITY)
Admission: RE | Admit: 2016-12-04 | Discharge: 2016-12-07 | DRG: 765 | Disposition: A | Discharge status: Home / Self Care | Payer: 59 | Source: Ambulatory Visit | Attending: Obstetrics and Gynecology | Admitting: Obstetrics and Gynecology

## 2016-12-04 ENCOUNTER — Inpatient Hospital Stay (HOSPITAL_COMMUNITY): Payer: 59 | Admitting: Anesthesiology

## 2016-12-04 ENCOUNTER — Encounter (HOSPITAL_COMMUNITY): Payer: Self-pay

## 2016-12-04 DIAGNOSIS — Z98891 History of uterine scar from previous surgery: Secondary | ICD-10-CM

## 2016-12-04 DIAGNOSIS — O3433 Maternal care for cervical incompetence, third trimester: Secondary | ICD-10-CM | POA: Diagnosis present

## 2016-12-04 DIAGNOSIS — O365930 Maternal care for other known or suspected poor fetal growth, third trimester, not applicable or unspecified: Principal | ICD-10-CM | POA: Diagnosis present

## 2016-12-04 DIAGNOSIS — Z3A37 37 weeks gestation of pregnancy: Secondary | ICD-10-CM | POA: Diagnosis not present

## 2016-12-04 DIAGNOSIS — O99824 Streptococcus B carrier state complicating childbirth: Secondary | ICD-10-CM | POA: Diagnosis present

## 2016-12-04 DIAGNOSIS — O1002 Pre-existing essential hypertension complicating childbirth: Secondary | ICD-10-CM | POA: Diagnosis present

## 2016-12-04 DIAGNOSIS — Z6839 Body mass index (BMI) 39.0-39.9, adult: Secondary | ICD-10-CM

## 2016-12-04 DIAGNOSIS — O10919 Unspecified pre-existing hypertension complicating pregnancy, unspecified trimester: Secondary | ICD-10-CM | POA: Diagnosis present

## 2016-12-04 DIAGNOSIS — O99214 Obesity complicating childbirth: Secondary | ICD-10-CM | POA: Diagnosis present

## 2016-12-04 DIAGNOSIS — O4100X0 Oligohydramnios, unspecified trimester, not applicable or unspecified: Secondary | ICD-10-CM | POA: Diagnosis present

## 2016-12-04 DIAGNOSIS — Z3490 Encounter for supervision of normal pregnancy, unspecified, unspecified trimester: Secondary | ICD-10-CM

## 2016-12-04 DIAGNOSIS — O24420 Gestational diabetes mellitus in childbirth, diet controlled: Secondary | ICD-10-CM | POA: Diagnosis present

## 2016-12-04 DIAGNOSIS — O365931 Maternal care for other known or suspected poor fetal growth, third trimester, fetus 1: Secondary | ICD-10-CM | POA: Diagnosis present

## 2016-12-04 DIAGNOSIS — O4103X0 Oligohydramnios, third trimester, not applicable or unspecified: Secondary | ICD-10-CM | POA: Diagnosis present

## 2016-12-04 DIAGNOSIS — O24410 Gestational diabetes mellitus in pregnancy, diet controlled: Secondary | ICD-10-CM | POA: Diagnosis present

## 2016-12-04 LAB — CBC
HCT: 35.3 % — ABNORMAL LOW (ref 36.0–46.0)
HEMOGLOBIN: 12.2 g/dL (ref 12.0–15.0)
MCH: 32.3 pg (ref 26.0–34.0)
MCHC: 34.6 g/dL (ref 30.0–36.0)
MCV: 93.4 fL (ref 78.0–100.0)
Platelets: 231 10*3/uL (ref 150–400)
RBC: 3.78 MIL/uL — ABNORMAL LOW (ref 3.87–5.11)
RDW: 13.9 % (ref 11.5–15.5)
WBC: 4.3 10*3/uL (ref 4.0–10.5)

## 2016-12-04 LAB — RPR: RPR: NONREACTIVE

## 2016-12-04 LAB — COMPREHENSIVE METABOLIC PANEL
ALK PHOS: 57 U/L (ref 38–126)
ALT: 11 U/L — ABNORMAL LOW (ref 14–54)
ANION GAP: 6 (ref 5–15)
AST: 59 U/L — ABNORMAL HIGH (ref 15–41)
Albumin: 2.7 g/dL — ABNORMAL LOW (ref 3.5–5.0)
BILIRUBIN TOTAL: 2 mg/dL — AB (ref 0.3–1.2)
BUN: 11 mg/dL (ref 6–20)
CALCIUM: 8.7 mg/dL — AB (ref 8.9–10.3)
CO2: 19 mmol/L — ABNORMAL LOW (ref 22–32)
Chloride: 109 mmol/L (ref 101–111)
Creatinine, Ser: 0.86 mg/dL (ref 0.44–1.00)
GFR calc non Af Amer: >60 mL/min (ref >60)
Glucose, Bld: 114 mg/dL — ABNORMAL HIGH (ref 65–99)
POTASSIUM: 6.2 mmol/L — AB (ref 3.5–5.1)
SODIUM: 134 mmol/L — AB (ref 135–145)
TOTAL PROTEIN: 5.3 g/dL — AB (ref 6.5–8.1)

## 2016-12-04 LAB — GLUCOSE, CAPILLARY
GLUCOSE-CAPILLARY: 83 mg/dL (ref 65–99)
Glucose-Capillary: 112 mg/dL — ABNORMAL HIGH (ref 65–99)
Glucose-Capillary: 61 mg/dL — ABNORMAL LOW (ref 65–99)

## 2016-12-04 LAB — TYPE AND SCREEN
ABO/RH(D): B POS
ANTIBODY SCREEN: NEGATIVE

## 2016-12-04 LAB — ABO/RH: ABO/RH(D): B POS

## 2016-12-04 SURGERY — Surgical Case
Anesthesia: Epidural

## 2016-12-04 MED ORDER — OXYTOCIN BOLUS FROM INFUSION: 500.0000 mL | Freq: Once | INTRAVENOUS | Status: DC

## 2016-12-04 MED ORDER — OXYCODONE-ACETAMINOPHEN 5-325 MG PO TABS: 1.0000 | ORAL_TABLET | ORAL | Status: DC | PRN

## 2016-12-04 MED ORDER — TERBUTALINE SULFATE 1 MG/ML IJ SOLN: 0.2500 mg | Freq: Once | INTRAMUSCULAR | Status: DC | PRN

## 2016-12-04 MED ORDER — KETOROLAC TROMETHAMINE 30 MG/ML IJ SOLN
30.0000 mg | Freq: Four times a day (QID) | INTRAMUSCULAR | Status: AC | PRN
  Administered 2016-12-04 – 2016-12-05 (×2): 30 mg via INTRAVENOUS
  Filled 2016-12-04 (×2): qty 1

## 2016-12-04 MED ORDER — DIPHENHYDRAMINE HCL 25 MG PO CAPS: 25.0000 mg | ORAL_CAPSULE | ORAL | Status: DC | PRN

## 2016-12-04 MED ORDER — LABETALOL HCL 200 MG PO TABS
400.0000 mg | ORAL_TABLET | Freq: Once | ORAL | Status: AC
  Administered 2016-12-04: 400 mg via ORAL
  Filled 2016-12-04: qty 2

## 2016-12-04 MED ORDER — NALOXONE HCL 0.4 MG/ML IJ SOLN: 0.4000 mg | INTRAMUSCULAR | Status: DC | PRN

## 2016-12-04 MED ORDER — PHENYLEPHRINE 40 MCG/ML (10ML) SYRINGE FOR IV PUSH (FOR BLOOD PRESSURE SUPPORT): 80.0000 ug | PREFILLED_SYRINGE | INTRAVENOUS | Status: DC | PRN

## 2016-12-04 MED ORDER — LIDOCAINE HCL (PF) 1 % IJ SOLN
INTRAMUSCULAR | Status: DC | PRN
  Administered 2016-12-04 (×2): 5 mL via EPIDURAL

## 2016-12-04 MED ORDER — ACETAMINOPHEN 325 MG PO TABS
650.0000 mg | ORAL_TABLET | ORAL | Status: DC | PRN
  Administered 2016-12-05 – 2016-12-06 (×2): 650 mg via ORAL
  Filled 2016-12-04 (×2): qty 2

## 2016-12-04 MED ORDER — METHYLERGONOVINE MALEATE 0.2 MG/ML IJ SOLN: 0.2000 mg | INTRAMUSCULAR | Status: DC | PRN

## 2016-12-04 MED ORDER — SCOPOLAMINE 1 MG/3DAYS TD PT72
MEDICATED_PATCH | TRANSDERMAL | Status: AC
  Filled 2016-12-04: qty 1

## 2016-12-04 MED ORDER — LABETALOL HCL 200 MG PO TABS: 400.0000 mg | ORAL_TABLET | Freq: Three times a day (TID) | ORAL | Status: DC

## 2016-12-04 MED ORDER — PENICILLIN G POT IN DEXTROSE 60000 UNIT/ML IV SOLN
3.0000 10*6.[IU] | INTRAVENOUS | Status: DC
  Administered 2016-12-04 (×2): 3 10*6.[IU] via INTRAVENOUS
  Filled 2016-12-04 (×4): qty 50

## 2016-12-04 MED ORDER — DIPHENHYDRAMINE HCL 50 MG/ML IJ SOLN: 12.5000 mg | INTRAMUSCULAR | Status: DC | PRN

## 2016-12-04 MED ORDER — ONDANSETRON HCL 4 MG/2ML IJ SOLN
4.0000 mg | Freq: Three times a day (TID) | INTRAMUSCULAR | Status: DC | PRN
Start: 2016-12-04 — End: 2016-12-07

## 2016-12-04 MED ORDER — SODIUM CHLORIDE 0.9 % IR SOLN
Status: DC | PRN
  Administered 2016-12-04: 1000 mL

## 2016-12-04 MED ORDER — LACTATED RINGERS IV SOLN: 500.0000 mL | Freq: Once | INTRAVENOUS | Status: DC

## 2016-12-04 MED ORDER — DIPHENHYDRAMINE HCL 25 MG PO CAPS: 25.0000 mg | ORAL_CAPSULE | Freq: Four times a day (QID) | ORAL | Status: DC | PRN

## 2016-12-04 MED ORDER — PENICILLIN G POTASSIUM 5000000 UNITS IJ SOLR
5.0000 10*6.[IU] | Freq: Once | INTRAVENOUS | Status: AC
  Administered 2016-12-04: 5 10*6.[IU] via INTRAVENOUS
  Filled 2016-12-04: qty 5

## 2016-12-04 MED ORDER — OXYCODONE-ACETAMINOPHEN 5-325 MG PO TABS
2.0000 | ORAL_TABLET | ORAL | Status: DC | PRN
  Filled 2016-12-04: qty 2

## 2016-12-04 MED ORDER — LACTATED RINGERS IV SOLN
INTRAVENOUS | Status: DC
  Administered 2016-12-04 (×2): via INTRAVENOUS

## 2016-12-04 MED ORDER — MORPHINE SULFATE (PF) 0.5 MG/ML IJ SOLN
INTRAMUSCULAR | Status: DC | PRN
  Administered 2016-12-04: 4 mg via EPIDURAL
  Administered 2016-12-04: 1 mg via INTRAVENOUS

## 2016-12-04 MED ORDER — ONDANSETRON HCL 4 MG/2ML IJ SOLN
INTRAMUSCULAR | Status: DC | PRN
  Administered 2016-12-04: 4 mg via INTRAVENOUS

## 2016-12-04 MED ORDER — METHYLERGONOVINE MALEATE 0.2 MG PO TABS: 0.2000 mg | ORAL_TABLET | ORAL | Status: DC | PRN

## 2016-12-04 MED ORDER — LACTATED RINGERS IV SOLN
INTRAVENOUS | Status: DC | PRN
  Administered 2016-12-04 (×2): via INTRAVENOUS

## 2016-12-04 MED ORDER — PRENATAL MULTIVITAMIN CH
1.0000 | ORAL_TABLET | Freq: Every day | ORAL | Status: DC
  Administered 2016-12-05 – 2016-12-06 (×2): 1 via ORAL
  Filled 2016-12-04 (×2): qty 1

## 2016-12-04 MED ORDER — PHENYLEPHRINE 40 MCG/ML (10ML) SYRINGE FOR IV PUSH (FOR BLOOD PRESSURE SUPPORT)
80.0000 ug | PREFILLED_SYRINGE | INTRAVENOUS | Status: DC | PRN
  Filled 2016-12-04: qty 10

## 2016-12-04 MED ORDER — SODIUM CHLORIDE 0.9% FLUSH: 3.0000 mL | INTRAVENOUS | Status: DC | PRN

## 2016-12-04 MED ORDER — LACTATED RINGERS IV SOLN: 500.0000 mL | INTRAVENOUS | Status: DC | PRN

## 2016-12-04 MED ORDER — LABETALOL HCL 5 MG/ML IV SOLN
40.0000 mg | Freq: Once | INTRAVENOUS | Status: AC
  Administered 2016-12-04: 40 mg via INTRAVENOUS

## 2016-12-04 MED ORDER — FENTANYL 2.5 MCG/ML BUPIVACAINE 1/10 % EPIDURAL INFUSION (WH - ANES)
INTRAMUSCULAR | Status: AC
  Filled 2016-12-04: qty 100

## 2016-12-04 MED ORDER — ACETAMINOPHEN 325 MG PO TABS: 650.0000 mg | ORAL_TABLET | ORAL | Status: DC | PRN

## 2016-12-04 MED ORDER — SIMETHICONE 80 MG PO CHEW
80.0000 mg | CHEWABLE_TABLET | Freq: Three times a day (TID) | ORAL | Status: DC
  Administered 2016-12-05 – 2016-12-06 (×6): 80 mg via ORAL
  Filled 2016-12-04 (×6): qty 1

## 2016-12-04 MED ORDER — NALBUPHINE HCL 10 MG/ML IJ SOLN: 5.0000 mg | INTRAMUSCULAR | Status: DC | PRN

## 2016-12-04 MED ORDER — OXYTOCIN 40 UNITS IN LACTATED RINGERS INFUSION - SIMPLE MED: 2.5000 [IU]/h | INTRAVENOUS | Status: DC

## 2016-12-04 MED ORDER — LIDOCAINE HCL (PF) 1 % IJ SOLN: 30.0000 mL | INTRAMUSCULAR | Status: DC | PRN

## 2016-12-04 MED ORDER — MORPHINE SULFATE (PF) 0.5 MG/ML IJ SOLN
INTRAMUSCULAR | Status: AC
  Filled 2016-12-04: qty 10

## 2016-12-04 MED ORDER — COCONUT OIL OIL: 1.0000 "application " | TOPICAL_OIL | Status: DC | PRN

## 2016-12-04 MED ORDER — IBUPROFEN 600 MG PO TABS
600.0000 mg | ORAL_TABLET | Freq: Four times a day (QID) | ORAL | Status: DC
  Administered 2016-12-05 – 2016-12-07 (×8): 600 mg via ORAL
  Filled 2016-12-04 (×9): qty 1

## 2016-12-04 MED ORDER — OXYTOCIN 40 UNITS IN LACTATED RINGERS INFUSION - SIMPLE MED
1.0000 m[IU]/min | INTRAVENOUS | Status: DC
  Administered 2016-12-04: 2 m[IU]/min via INTRAVENOUS
  Filled 2016-12-04: qty 1000

## 2016-12-04 MED ORDER — SIMETHICONE 80 MG PO CHEW
80.0000 mg | CHEWABLE_TABLET | ORAL | Status: DC
  Administered 2016-12-04 – 2016-12-06 (×2): 80 mg via ORAL
  Filled 2016-12-04 (×3): qty 1

## 2016-12-04 MED ORDER — PENICILLIN G POTASSIUM 5000000 UNITS IJ SOLR
2.5000 10*6.[IU] | INTRAVENOUS | Status: DC
  Filled 2016-12-04: qty 2.5

## 2016-12-04 MED ORDER — NALBUPHINE HCL 10 MG/ML IJ SOLN: 5.0000 mg | Freq: Once | INTRAMUSCULAR | Status: DC | PRN

## 2016-12-04 MED ORDER — OXYCODONE-ACETAMINOPHEN 5-325 MG PO TABS: 2.0000 | ORAL_TABLET | ORAL | Status: DC | PRN

## 2016-12-04 MED ORDER — MEPERIDINE HCL 25 MG/ML IJ SOLN: 6.2500 mg | INTRAMUSCULAR | Status: DC | PRN

## 2016-12-04 MED ORDER — LABETALOL HCL 200 MG PO TABS
400.0000 mg | ORAL_TABLET | Freq: Three times a day (TID) | ORAL | Status: DC
  Administered 2016-12-05 – 2016-12-07 (×7): 400 mg via ORAL
  Filled 2016-12-04 (×7): qty 2

## 2016-12-04 MED ORDER — OXYCODONE-ACETAMINOPHEN 5-325 MG PO TABS
1.0000 | ORAL_TABLET | ORAL | Status: DC | PRN
  Administered 2016-12-06 – 2016-12-07 (×2): 1 via ORAL
  Filled 2016-12-04 (×2): qty 1

## 2016-12-04 MED ORDER — BUPIVACAINE HCL (PF) 0.25 % IJ SOLN
INTRAMUSCULAR | Status: DC | PRN
  Administered 2016-12-04: 30 mL

## 2016-12-04 MED ORDER — ZOLPIDEM TARTRATE 5 MG PO TABS: 5.0000 mg | ORAL_TABLET | Freq: Every evening | ORAL | Status: DC | PRN

## 2016-12-04 MED ORDER — FENTANYL 2.5 MCG/ML BUPIVACAINE 1/10 % EPIDURAL INFUSION (WH - ANES)
14.0000 mL/h | INTRAMUSCULAR | Status: DC | PRN
  Administered 2016-12-04: 14 mL/h via EPIDURAL

## 2016-12-04 MED ORDER — OXYTOCIN 10 UNIT/ML IJ SOLN
INTRAVENOUS | Status: DC | PRN
  Administered 2016-12-04: 40 [IU] via INTRAVENOUS

## 2016-12-04 MED ORDER — SENNOSIDES-DOCUSATE SODIUM 8.6-50 MG PO TABS
2.0000 | ORAL_TABLET | ORAL | Status: DC
  Administered 2016-12-04 – 2016-12-06 (×2): 2 via ORAL
  Filled 2016-12-04 (×3): qty 2

## 2016-12-04 MED ORDER — NALBUPHINE HCL 10 MG/ML IJ SOLN
5.0000 mg | INTRAMUSCULAR | Status: DC | PRN
  Administered 2016-12-04: 5 mg via SUBCUTANEOUS
  Filled 2016-12-04: qty 1

## 2016-12-04 MED ORDER — DIBUCAINE 1 % RE OINT: 1.0000 "application " | TOPICAL_OINTMENT | RECTAL | Status: DC | PRN

## 2016-12-04 MED ORDER — SOD CITRATE-CITRIC ACID 500-334 MG/5ML PO SOLN
30.0000 mL | ORAL | Status: DC | PRN
  Administered 2016-12-04: 30 mL via ORAL
  Filled 2016-12-04: qty 15

## 2016-12-04 MED ORDER — OXYTOCIN 40 UNITS IN LACTATED RINGERS INFUSION - SIMPLE MED: 2.5000 [IU]/h | INTRAVENOUS | Status: AC

## 2016-12-04 MED ORDER — LABETALOL HCL 5 MG/ML IV SOLN
INTRAVENOUS | Status: AC
  Filled 2016-12-04: qty 8

## 2016-12-04 MED ORDER — SIMETHICONE 80 MG PO CHEW: 80.0000 mg | CHEWABLE_TABLET | ORAL | Status: DC | PRN

## 2016-12-04 MED ORDER — MENTHOL 3 MG MT LOZG: 1.0000 | LOZENGE | OROMUCOSAL | Status: DC | PRN

## 2016-12-04 MED ORDER — SCOPOLAMINE 1 MG/3DAYS TD PT72: 1.0000 | MEDICATED_PATCH | Freq: Once | TRANSDERMAL | Status: DC

## 2016-12-04 MED ORDER — BUPIVACAINE HCL (PF) 0.25 % IJ SOLN
INTRAMUSCULAR | Status: AC
  Filled 2016-12-04: qty 30

## 2016-12-04 MED ORDER — TETANUS-DIPHTH-ACELL PERTUSSIS 5-2.5-18.5 LF-MCG/0.5 IM SUSP: 0.5000 mL | Freq: Once | INTRAMUSCULAR | Status: DC

## 2016-12-04 MED ORDER — NALOXONE HCL 2 MG/2ML IJ SOSY: 1.0000 ug/kg/h | PREFILLED_SYRINGE | INTRAVENOUS | Status: DC | PRN

## 2016-12-04 MED ORDER — ONDANSETRON HCL 4 MG/2ML IJ SOLN: 4.0000 mg | Freq: Four times a day (QID) | INTRAMUSCULAR | Status: DC | PRN

## 2016-12-04 MED ORDER — EPHEDRINE 5 MG/ML INJ: 10.0000 mg | INTRAVENOUS | Status: DC | PRN

## 2016-12-04 MED ORDER — KETOROLAC TROMETHAMINE 30 MG/ML IJ SOLN: 30.0000 mg | Freq: Four times a day (QID) | INTRAMUSCULAR | Status: AC | PRN

## 2016-12-04 MED ORDER — ONDANSETRON HCL 4 MG/2ML IJ SOLN
INTRAMUSCULAR | Status: AC
  Filled 2016-12-04: qty 2

## 2016-12-04 MED ORDER — LACTATED RINGERS IV SOLN: INTRAVENOUS | Status: DC

## 2016-12-04 MED ORDER — CEFAZOLIN SODIUM-DEXTROSE 2-3 GM-% IV SOLR
INTRAVENOUS | Status: DC | PRN
  Administered 2016-12-04: 2 g via INTRAVENOUS

## 2016-12-04 MED ORDER — OXYTOCIN 10 UNIT/ML IJ SOLN
INTRAMUSCULAR | Status: AC
  Filled 2016-12-04: qty 4

## 2016-12-04 MED ORDER — WITCH HAZEL-GLYCERIN EX PADS: 1.0000 "application " | MEDICATED_PAD | CUTANEOUS | Status: DC | PRN

## 2016-12-04 MED ORDER — SODIUM BICARBONATE 8.4 % IV SOLN
INTRAVENOUS | Status: DC | PRN
  Administered 2016-12-04 (×2): 5 mL via EPIDURAL

## 2016-12-04 SURGICAL SUPPLY — 42 items
ADH SKN CLS LQ APL DERMABOND (GAUZE/BANDAGES/DRESSINGS) ×1
CHLORAPREP W/TINT 26ML (MISCELLANEOUS) ×3 IMPLANT
CLAMP CORD UMBIL (MISCELLANEOUS) ×2 IMPLANT
CLOTH BEACON ORANGE TIMEOUT ST (SAFETY) ×3 IMPLANT
CONTAINER PREFILL 10% NBF 15ML (MISCELLANEOUS) IMPLANT
DERMABOND ADHESIVE PROPEN (GAUZE/BANDAGES/DRESSINGS) ×2
DERMABOND ADVANCED .7 DNX6 (GAUZE/BANDAGES/DRESSINGS) IMPLANT
DRSG OPSITE POSTOP 4X10 (GAUZE/BANDAGES/DRESSINGS) ×3 IMPLANT
ELECT REM PT RETURN 9FT ADLT (ELECTROSURGICAL) ×3
ELECTRODE REM PT RTRN 9FT ADLT (ELECTROSURGICAL) ×1 IMPLANT
EXTRACTOR VACUUM M CUP 4 TUBE (SUCTIONS) IMPLANT
EXTRACTOR VACUUM M CUP 4' TUBE (SUCTIONS)
GLOVE BIO SURGEON STRL SZ 6.5 (GLOVE) ×1 IMPLANT
GLOVE BIO SURGEON STRL SZ7.5 (GLOVE) ×3 IMPLANT
GLOVE BIO SURGEONS STRL SZ 6.5 (GLOVE) ×1
GLOVE BIOGEL PI IND STRL 7.0 (GLOVE) ×1 IMPLANT
GLOVE BIOGEL PI INDICATOR 7.0 (GLOVE) ×6
GOWN STRL REUS W/TWL LRG LVL3 (GOWN DISPOSABLE) ×8 IMPLANT
HOVERMATT SINGLE USE (MISCELLANEOUS) ×2 IMPLANT
KIT ABG SYR 3ML LUER SLIP (SYRINGE) ×2 IMPLANT
NDL HYPO 25X5/8 SAFETYGLIDE (NEEDLE) IMPLANT
NDL SPNL 20GX3.5 QUINCKE YW (NEEDLE) IMPLANT
NEEDLE HYPO 22GX1.5 SAFETY (NEEDLE) ×3 IMPLANT
NEEDLE HYPO 25X5/8 SAFETYGLIDE (NEEDLE) ×3 IMPLANT
NEEDLE SPNL 20GX3.5 QUINCKE YW (NEEDLE) IMPLANT
NS IRRIG 1000ML POUR BTL (IV SOLUTION) ×3 IMPLANT
PACK C SECTION WH (CUSTOM PROCEDURE TRAY) ×3 IMPLANT
PENCIL SMOKE EVAC W/HOLSTER (ELECTROSURGICAL) ×3 IMPLANT
RETAINER VISCERAL (MISCELLANEOUS) ×2 IMPLANT
SUT MNCRL 0 VIOLET CTX 36 (SUTURE) ×2 IMPLANT
SUT MNCRL AB 3-0 PS2 27 (SUTURE) IMPLANT
SUT MON AB 2-0 CT1 27 (SUTURE) ×3 IMPLANT
SUT MON AB-0 CT1 36 (SUTURE) ×6 IMPLANT
SUT MONOCRYL 0 CTX 36 (SUTURE) ×4
SUT PLAIN 0 NONE (SUTURE) IMPLANT
SUT PLAIN 2 0 (SUTURE)
SUT PLAIN 2 0 XLH (SUTURE) ×2 IMPLANT
SUT PLAIN ABS 2-0 CT1 27XMFL (SUTURE) IMPLANT
SYR 20CC LL (SYRINGE) IMPLANT
SYR CONTROL 10ML LL (SYRINGE) ×3 IMPLANT
TOWEL OR 17X24 6PK STRL BLUE (TOWEL DISPOSABLE) ×5 IMPLANT
TRAY FOLEY BAG SILVER LF 14FR (SET/KITS/TRAYS/PACK) ×3 IMPLANT

## 2016-12-04 NOTE — Progress Notes (Signed)
Leslie Campbell is a 41 y.o. [id number] at 3w0dby LMP admitted for induction of labor due to Low amniotic fluid., Gestational diabetes, Hypertension and Poor fetal growth.  Subjective: Comfortable. No HAs, No epigastric pain  Objective: BP (!) 158/99   Pulse 79   Temp 98.3 F (36.8 C) (Axillary)   Resp 18   Ht 5' 5"  (1.651 m)   Wt 107 kg (236 lb)   LMP 02/22/2016   BMI 39.27 kg/m  No intake/output data recorded. No intake/output data recorded.  FHT:  FHR: 145 bpm, variability: moderate,  accelerations:  Present,  decelerations:  Absent 10x10 accels. UC:   irregular, every 3-5 minutes SVE:   2-3/80/-1 AROM- minimal  Labs: Lab Results  Component Value Date   WBC 4.3 12/04/2016   HGB 12.2 12/04/2016   HCT 35.3 (L) 12/04/2016   MCV 93.4 12/04/2016   PLT 231 12/04/2016    Assessment / Plan: Induction of labor due to IUGR, gestational hypertension and gestational diabetes,  progressing well on pitocin  Labor: Progressing normally Preeclampsia:  no signs or symptoms of toxicity, intake and ouput balanced and labs stable Fetal Wellbeing:  Category I Pain Control:  Labor support without medications I/D:  n/a Anticipated MOD:  guarded  Leslie Campbell J 12/04/2016, 10:15 AM

## 2016-12-04 NOTE — Anesthesia Pain Management Evaluation Note (Signed)
  CRNA Pain Management Visit Note  Patient: Leslie Campbell, 41 y.o., female  "Hello I am a member of the anesthesia team at [facility]. We have an anesthesia team available at all times to provide care throughout the hospital, including epidural management and anesthesia for C-section. I don't know your plan for the delivery whether it a natural birth, water birth, IV sedation, nitrous supplementation, doula or epidural, but we want to meet your pain goals."   1.Was your pain managed to your expectations on prior hospitalizations?   yes  2.What is your expectation for pain management during this hospitalization?     epidural  3.How can we help you reach that goal? epidural  Record the patient's initial score and the patient's pain goal.   Pain: 0/10  Pain Goal: 4/10 [facility] wants you to be able to say your pain was always managed very well.  Ailene Ards 12/04/2016

## 2016-12-04 NOTE — Op Note (Signed)
Cesarean Section Procedure Note  Indications: failure to progress: arrest of dilation  Pre-operative Diagnosis: 37 week 0 day pregnancy.  Post-operative Diagnosis: same  Surgeon: Lovenia Kim   Assistants: Claudette Laws, CNM  Anesthesia: Epidural anesthesia and Local anesthesia 0.25.% bupivacaine  ASA Class: 2  Procedure Details  The patient was seen in the Holding Room. The risks, benefits, complications, treatment options, and expected outcomes were discussed with the patient.  The patient concurred with the proposed plan, giving informed consent. The risks of anesthesia, infection, bleeding and possible injury to other organs discussed. Injury to bowel, bladder, or ureter with possible need for repair discussed. Possible need for transfusion with secondary risks of hepatitis or HIV acquisition discussed. Post operative complications to include but not limited to DVT, PE and Pneumonia noted. The site of surgery properly noted/marked. The patient was taken to Operating Room # 9, identified as Leslie Campbell and the procedure verified as C-Section Delivery. A Time Out was held and the above information confirmed.  After induction of anesthesia, the patient was draped and prepped in the usual sterile manner. A Pfannenstiel incision was made and carried down through the subcutaneous tissue to the fascia. Fascial incision was made and extended transversely using Mayo scissors. The fascia was separated from the underlying rectus tissue superiorly and inferiorly. The peritoneum was identified and entered. Peritoneal incision was extended longitudinally. The utero-vesical peritoneal reflection was incised transversely and the bladder flap was bluntly freed from the lower uterine segment. A low transverse uterine incision(Kerr hysterotomy) was made. Delivered from OA presentation was a  female with Apgar scores of 8 at one minute and 9 at five minutes. Bulb suctioning gently performed. Neonatal team in  attendance.After the umbilical cord was clamped and cut cord blood was obtained for evaluation. The placenta was removed intact and appeared normal. The uterus was curetted with a dry lap pack. Good hemostasis was noted.The uterine outline, tubes and ovaries appeared normal. The uterine incision was closed with running locked sutures of 0 Monocryl x 2 layers. Hemostasis was observed. Lavage was carried out until clear.The parietal peritoneum was closed with a running 2-0 Monocryl suture. The fascia was then reapproximated with running sutures of 0 Monocryl. The skin was reapproximated with after [place] closure with 2-0 plain. 3-0 monocryl used to close skin.  Instrument, sponge, and needle counts were correct prior the abdominal closure and at the conclusion of the case.   Findings: FTLF, nl uterus, nl tubes  Estimated Blood Loss:  500         Drains: foley                 Specimens: placenta                 Complications:  None; patient tolerated the procedure well.         Disposition: PACU - hemodynamically stable.         Condition: stable  Attending Attestation: I performed the procedure.

## 2016-12-04 NOTE — Progress Notes (Signed)
Leslie Campbell is a 41 y.o. [id number] at 68w0dby LMP admitted for induction of labor due to Low amniotic fluid., Gestational diabetes, Hypertension and Poor fetal growth.  Subjective: comfortable  Objective: BP (!) 151/90   Pulse 73   Temp 98.5 F (36.9 C)   Resp 16   Ht 5' 5"  (1.651 m)   Wt 107 kg (236 lb)   LMP 02/22/2016   SpO2 99%   BMI 39.27 kg/m  No intake/output data recorded. Total I/O In: -  Out: 636[Urine:650]  FHT:  FHR: 145 bpm, variability: moderate,  accelerations:  Present,  decelerations:  Absent UC:   regular, every 2 minutes SVE:   Dilation: 4 Effacement (%): 90 Station: -1 Exam by:: Dr. TRonita Hipps200 + MVU(adequate since 1300) Labs: Lab Results  Component Value Date   WBC 4.3 12/04/2016   HGB 12.2 12/04/2016   HCT 35.3 (L) 12/04/2016   MCV 93.4 12/04/2016   PLT 231 12/04/2016    Assessment / Plan: Arrest in active phase of labor  Labor: Proceed with Csection. Consent done. Preeclampsia:  no signs or symptoms of toxicity, intake and ouput balanced and labs stable Fetal Wellbeing:  Category I Pain Control:  Epidural I/D:  n/a Anticipated MOD:  csection. Risks vs benefits discussed.   Leslie Campbell J 12/04/2016, 5:53 PM

## 2016-12-04 NOTE — H&P (Addendum)
Leslie Campbell is a 41 y.o. female presenting for IOL for IUGR with oligo and CHTN on multiple meds. OB History    Gravida Para Term Preterm AB Living   5       4     SAB TAB Ectopic Multiple Live Births   4             Past Medical History:  Diagnosis Date  . HTN (hypertension)   . Hyperlipidemia   . Infertility associated with anovulation   . Infertility, female    clomid  . Migraine   . Miscarriage 01/2010  . Miscarriage 2012   times 2  . PCOS (polycystic ovarian syndrome)   . 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 CCY    . CERVICAL CERCLAGE N/A 06/22/2016   Procedure: Linna Caprice CERVICAL;  Surgeon: Brien Few, MD;  Location: [facility];  Service: Gynecology;  Laterality: N/A;  EDD: 12/25/16  . CHOLECYSTECTOMY    . LAPAROSCOPIC OVARIAN CYSTECTOMY Bilateral   . miscarriage  01/2010  . NO PAST SURGERIES    . WISDOM TOOTH EXTRACTION     Family History: family history includes Diabetes in her brother and other; Hypertension in her father and mother. Social History:  reports that she has never smoked. She has never used smokeless tobacco. She reports that she does not drink alcohol or use drugs.     Maternal Diabetes: Yes:  Diabetes Type:  Diet controlled Genetic Screening: Normal Maternal Ultrasounds/Referrals: Normal Fetal Ultrasounds or other Referrals:  None Maternal Substance Abuse:  No Significant Maternal Medications:  Meds include: Other:  Significant Maternal Lab Results:  None Other Comments:  IUGR  Review of Systems  Constitutional: Negative.   All other systems reviewed and are negative.  Maternal Medical History:  Contractions: Onset was less than 1 hour ago.   Perceived severity is mild.    Fetal activity: Perceived fetal activity is normal.   Last perceived fetal movement was within the past hour.    Prenatal complications: PIH and IUGR.   Prenatal Complications - Diabetes:  gestational.      Blood pressure (!) 147/90, pulse 90, temperature 98.3 F (36.8 C), temperature source Axillary, resp. rate 17, height 5' 5"  (1.651 m), weight 107 kg (236 lb), last menstrual period 02/22/2016. Maternal Exam:  Uterine Assessment: Contraction strength is mild.  Contraction frequency is irregular.   Abdomen: Patient reports no abdominal tenderness. Fetal presentation: vertex  Introitus: Normal vulva. Normal vagina.  Ferning test: not done.  Nitrazine test: not done. Amniotic fluid character: not assessed.  Pelvis: adequate for delivery.   Cervix: Cervix evaluated by digital exam.     Physical Exam  Nursing note and vitals reviewed. Constitutional: She is oriented to person, place, and time. She appears well-developed and well-nourished.  HENT:  Head: Normocephalic and atraumatic.  Neck: Normal range of motion. Neck supple.  Cardiovascular: Normal rate and regular rhythm.   Respiratory: Effort normal and breath sounds normal.  GI: Soft. Bowel sounds are normal.  Genitourinary: Vagina normal and uterus normal.  Musculoskeletal: Normal range of motion.  Neurological: She is alert and oriented to person, place, and time. She has normal reflexes.  Skin: Skin is warm and dry.  Psychiatric: She has a normal mood and affect.    Prenatal labs: ABO, Rh: B/Positive/-- (12/05 0000) Antibody: Negative (12/05 0000) Rubella: Immune (12/05 0000) RPR: Nonreactive (12/05 0000)  HBsAg: Negative (12/05 0000)  HIV: Non-reactive (  12/05 0000)  GBS: Positive (05/15 0000)   Assessment/Plan: 37w IUP CHTN GDM IUGR GBS pos BMZ complete History of Cervical insufficiency s/p cerclage removal IOL   Tejah Vazguez J 12/04/2016, 8:29 AM

## 2016-12-04 NOTE — Anesthesia Postprocedure Evaluation (Signed)
Anesthesia Post Note  Patient: Leslie Campbell  Procedure(s) Performed: Procedure(s) (LRB): CESAREAN SECTION (N/A)     Patient location during evaluation: PACU Anesthesia Type: Epidural Level of consciousness: awake and alert Pain management: pain level controlled Vital Signs Assessment: post-procedure vital signs reviewed and stable Respiratory status: spontaneous breathing, nonlabored ventilation and respiratory function stable Cardiovascular status: stable Postop Assessment: no headache, no backache and epidural receding Anesthetic complications: no    Last Vitals:  Vitals:   12/04/16 2007 12/04/16 2008  BP:    Pulse: 89 92  Resp: (!) 22 20  Temp:      Last Pain:  Vitals:   12/04/16 1952  TempSrc:   PainSc: 0-No pain   Pain Goal:                 Karyl Kinnier Ilamae Geng

## 2016-12-04 NOTE — Anesthesia Preprocedure Evaluation (Signed)
Anesthesia Evaluation  Patient identified by MRN, date of birth, ID band Patient awake    Reviewed: Allergy & Precautions, NPO status , Patient's Chart, lab work & pertinent test results  Airway Mallampati: III  TM Distance: >3 FB Neck ROM: Full    Dental no notable dental hx. (+) Dental Advisory Given   Pulmonary neg pulmonary ROS,    Pulmonary exam normal        Cardiovascular hypertension, Normal cardiovascular exam     Neuro/Psych Pituitary hyperfunction negative psych ROS   GI/Hepatic negative GI ROS, Neg liver ROS,   Endo/Other  Morbid obesity  Renal/GU negative Renal ROS  negative genitourinary   Musculoskeletal negative musculoskeletal ROS (+)   Abdominal   Peds negative pediatric ROS (+)  Hematology negative hematology ROS (+)   Anesthesia Other Findings   Reproductive/Obstetrics negative OB ROS                             Anesthesia Physical Anesthesia Plan  ASA: III  Anesthesia Plan: Epidural   Post-op Pain Management:    Induction:   PONV Risk Score and Plan:   Airway Management Planned: Natural Airway  Additional Equipment:   Intra-op Plan:   Post-operative Plan:   Informed Consent: I have reviewed the patients History and Physical, chart, labs and discussed the procedure including the risks, benefits and alternatives for the proposed anesthesia with the patient or authorized representative who has indicated his/her understanding and acceptance.     Plan Discussed with: Anesthesiologist  Anesthesia Plan Comments:         Anesthesia Quick Evaluation

## 2016-12-04 NOTE — Transfer of Care (Signed)
Immediate Anesthesia Transfer of Care Note  Patient: Leslie Campbell  Procedure(s) Performed: Procedure(s): CESAREAN SECTION (N/A)  Patient Location: PACU  Anesthesia Type:Epidural  Level of Consciousness: awake, alert  and oriented  Airway & Oxygen Therapy: Patient Spontanous Breathing  Post-op Assessment: Report given to RN and Post -op Vital signs reviewed and stable  Post vital signs: Reviewed and stable  Last Vitals:  Vitals:   12/04/16 1700 12/04/16 1731  BP: (!) 158/65 (!) 151/90  Pulse: 75 73  Resp:    Temp:      Last Pain:  Vitals:   12/04/16 1700  TempSrc:   PainSc: 0-No pain         Complications: No apparent anesthesia complications

## 2016-12-04 NOTE — Progress Notes (Signed)
Leslie Campbell is a 41 y.o. [id number] at 38w0dby LMP admitted for induction of labor due to Low amniotic fluid., Gestational diabetes, Hypertension and Poor fetal growth.  Subjective: Comfortable. No HAs or epigastric pain or vision changes.   Objective: BP (!) 153/82   Pulse 71   Temp 97.9 F (36.6 C) (Axillary)   Resp 18   Ht 5' 5"  (1.651 m)   Wt 107 kg (236 lb)   LMP 02/22/2016   SpO2 99%   BMI 39.27 kg/m  No intake/output data recorded. No intake/output data recorded.  FHT:  FHR: 145 bpm, variability: moderate,  accelerations:  Present,  decelerations:  Absent UC:   irregular, every 2-5 minutes SVE:   Dilation: 4 Effacement (%): 90 Station: -1 Exam by:: Dr. TRonita Hipps IUPC placed - approx 110-120MVU  Labs: Lab Results  Component Value Date   WBC 4.3 12/04/2016   HGB 12.2 12/04/2016   HCT 35.3 (L) 12/04/2016   MCV 93.4 12/04/2016   PLT 231 12/04/2016    Assessment / Plan: 37w IUGR- fetal status stable. GBS CHTN - stable. GDM-BS stable.  Labor: Progressing normally Preeclampsia:  no signs or symptoms of toxicity, intake and ouput balanced and labs stable Fetal Wellbeing:  Category I Pain Control:  Epidural I/D:  guarded Anticipated MOD:  guarded  Kunta Hilleary J 12/04/2016, 1:22 PM

## 2016-12-04 NOTE — Anesthesia Procedure Notes (Signed)
Epidural Patient location during procedure: OB Start time: 12/04/2016 11:31 AM End time: 12/04/2016 11:48 AM  Staffing Anesthesiologist: Duane Boston Performed: anesthesiologist   Preanesthetic Checklist Completed: patient identified, site marked, pre-op evaluation, timeout performed, IV checked, risks and benefits discussed and monitors and equipment checked  Epidural Patient position: sitting Prep: DuraPrep Patient monitoring: heart rate, cardiac monitor, continuous pulse ox and blood pressure Approach: midline Location: L2-L3 Injection technique: LOR saline  Needle:  Needle type: Tuohy  Needle gauge: 17 G Needle length: 9 cm Needle insertion depth: 8 cm Catheter size: 20 Guage Catheter at skin depth: 12 cm Test dose: negative and Other  Assessment Events: blood not aspirated, injection not painful, no injection resistance and negative IV test  Additional Notes Informed consent obtained prior to proceeding including risk of failure, 1% risk of PDPH, risk of minor discomfort and bruising.  Discussed rare but serious complications including epidural abscess, permanent nerve injury, epidural hematoma.  Discussed alternatives to epidural analgesia and patient desires to proceed.  Timeout performed pre-procedure verifying patient name, procedure, and platelet count.  Patient tolerated procedure well. Difficult placement

## 2016-12-05 LAB — COMPREHENSIVE METABOLIC PANEL
ALT: 15 U/L (ref 14–54)
ANION GAP: 4 — AB (ref 5–15)
AST: 21 U/L (ref 15–41)
Albumin: 2.2 g/dL — ABNORMAL LOW (ref 3.5–5.0)
Alkaline Phosphatase: 52 U/L (ref 38–126)
BILIRUBIN TOTAL: 0.5 mg/dL (ref 0.3–1.2)
BUN: 8 mg/dL (ref 6–20)
CHLORIDE: 109 mmol/L (ref 101–111)
CO2: 23 mmol/L (ref 22–32)
Calcium: 8.2 mg/dL — ABNORMAL LOW (ref 8.9–10.3)
Creatinine, Ser: 0.85 mg/dL (ref 0.44–1.00)
GFR calc Af Amer: >60 mL/min (ref >60)
Glucose, Bld: 84 mg/dL (ref 65–99)
POTASSIUM: 3.8 mmol/L (ref 3.5–5.1)
Sodium: 136 mmol/L (ref 135–145)
TOTAL PROTEIN: 4.6 g/dL — AB (ref 6.5–8.1)

## 2016-12-05 LAB — CBC
HEMATOCRIT: 30.7 % — AB (ref 36.0–46.0)
Hemoglobin: 10.8 g/dL — ABNORMAL LOW (ref 12.0–15.0)
MCH: 32.7 pg (ref 26.0–34.0)
MCHC: 35.2 g/dL (ref 30.0–36.0)
MCV: 93 fL (ref 78.0–100.0)
PLATELETS: 205 10*3/uL (ref 150–400)
RBC: 3.3 MIL/uL — ABNORMAL LOW (ref 3.87–5.11)
RDW: 14 % (ref 11.5–15.5)
WBC: 5.6 10*3/uL (ref 4.0–10.5)

## 2016-12-05 NOTE — Progress Notes (Signed)
POD# 1, S/P 1LTCS, indication - Active phase arrest, baby girl "Addison", CHTN, GDM  Subjective:  Reports feeling well, was upstairs in NICU most morning visiting newborn (hypothermia, IUGR),  breastpumping q 3 hrs.  Patient reports tolerating PO.  Breast symptoms: no colostrum seen Pain controlled with PO meds Denies HA/SOB/C/P/N/V/dizziness. Flatus present. She reports vaginal bleeding as normal, without clots.  She is ambulating, urinating without difficulty.     Objective:   VS:    Vitals:   12/05/16 0003 12/05/16 0555 12/05/16 0900 12/05/16 1250  BP: (!) 148/93 132/79 (!) 150/90 (!) 140/97  Pulse:  79 84 85  Resp: 17 18 17 17   Temp: 98 F (36.7 C) 98 F (36.7 C) 98.5 F (36.9 C) 98.4 F (36.9 C)  TempSrc: Oral Oral Axillary Oral  SpO2: 98%     Weight:      Height:         Intake/Output Summary (Last 24 hours) at 12/05/16 0846 Last data filed at 12/05/16 0400  Gross per 24 hour  Intake             1400 ml  Output             2100 ml  Net             -700 ml        Recent Labs  12/04/16 0814 12/05/16 0526  WBC 4.3 5.6  HGB 12.2 10.8*  HCT 35.3* 30.7*  PLT 231 205     Blood type: --/--/B POS (06/05 0820)  Rubella: Immune (12/05 0000)     Physical Exam:  General: alert, cooperative and no distress CV: Regular rate and rhythm Resp: clear Abdomen: soft, nontender, normal bowel sounds Incision: clean, dry and intact Uterine Fundus: firm, below umbilicus, nontender Lochia: minimal Ext: edema +1 pedal and no redness or tenderness in the calves or thighs      Assessment/Plan: 41 y.o.   POD# 1. [id number]                  Principal Problem:   Postpartum care following cesarean delivery (6/5) Active Problems:   S/P cesarean section, indication: Active phase arrest    GDM, class A1   Chronic hypertension affecting pregnancy  - mild range BP, PEC labs wnl, on labetalol 400 mg TID   IUGR (intrauterine growth restriction) affecting care of mother, third  trimester, fetus 1   Doing well, stable.               Advance diet as tolerated Encourage rest when baby rests Breastfeeding support Encourage to ambulate Routine post-op care  Juliene Pina, CNM, MSN 12/05/2016, 8:46 AM

## 2016-12-05 NOTE — Progress Notes (Signed)
CSW acknowledges NICU admission.    Patient screened out for psychosocial assessment since none of the following apply:  Psychosocial stressors documented in mother or baby's chart  Gestation less than 32 weeks  Code at delivery   Infant with anomalies  Please contact the Clinical Social Worker if specific needs arise, or by MOB's request.

## 2016-12-05 NOTE — Anesthesia Postprocedure Evaluation (Signed)
Anesthesia Post Note  Patient: Leslie Campbell  Procedure(s) Performed: Procedure(s) (LRB): CESAREAN SECTION (N/A)     Patient location during evaluation: Mother Baby Anesthesia Type: Epidural Level of consciousness: awake and alert Pain management: pain level controlled Vital Signs Assessment: post-procedure vital signs reviewed and stable Respiratory status: spontaneous breathing Cardiovascular status: blood pressure returned to baseline Postop Assessment: no headache, patient able to bend at knees, no backache, no signs of nausea or vomiting, epidural receding and adequate PO intake Anesthetic complications: no    Last Vitals:  Vitals:   12/05/16 0003 12/05/16 0555  BP: (!) 148/93 132/79  Pulse:  79  Resp: 17 18  Temp: 36.7 C 36.7 C    Last Pain:  Vitals:   12/05/16 0555  TempSrc: Oral  PainSc:    Pain Goal:                 Ailene Ards

## 2016-12-05 NOTE — Lactation Note (Signed)
This note was copied from a baby's chart. Lactation Consultation Note  Patient Name: Leslie Campbell [id number] Date: 12/05/2016 Reason for consult: Initial assessment Baby at 27 hr of life. When to room 130 to visit with mom and she was in the NICU. There was a DEBP set up by the chair. The young man in the room identified himself  as [name]'s son. Left the lactation and NICU handouts with him and instructed him to have his mom call for lactation when she returned to the room.   Maternal Data    Feeding    LATCH Score/Interventions                      Lactation Tools Discussed/Used     Consult Status Consult Status: Follow-up Date: 12/06/16 Follow-up type: In-patient    Denzil Hughes 12/05/2016, 9:44 PM

## 2016-12-05 NOTE — Plan of Care (Signed)
Problem: Education: Goal: Knowledge of [facility] Education information/materials will improve Outcome: Completed/Met Date Met: 12/05/16 All admission paperwork, room orientation,saftey and pain education completed

## 2016-12-06 ENCOUNTER — Encounter (HOSPITAL_COMMUNITY): Payer: Self-pay | Admitting: Obstetrics and Gynecology

## 2016-12-06 MED ORDER — HYDROCHLOROTHIAZIDE 25 MG PO TABS
25.0000 mg | ORAL_TABLET | Freq: Every day | ORAL | Status: DC
  Administered 2016-12-06 – 2016-12-07 (×2): 25 mg via ORAL
  Filled 2016-12-06 (×3): qty 1

## 2016-12-06 MED ORDER — IBUPROFEN 600 MG PO TABS: 600.0000 mg | ORAL_TABLET | Freq: Four times a day (QID) | ORAL | 0 refills | Status: DC

## 2016-12-06 NOTE — Lactation Note (Signed)
This note was copied from a baby's chart. Lactation Consultation Note  Patient Name: Leslie Campbell [id number] Date: 12/06/2016 Reason for consult: Follow-up assessment  Baby 61 hours old. Mom reports that baby is in CN and is being brought to the room soon. Mom states that she has been pumping every 3 hours but is not seeing anything. Enc mom to continue pumping every 2-3 hours for a total of 8 times/24 hours followed by hand expression. Enc mom to call for assistance with latching as needed, and discussed with mom that baby will need to continue to be supplemented with formula after baby at the breast.   Maternal Data Has patient been taught Hand Expression?: Yes (Per patient.) Does the patient have breastfeeding experience prior to this delivery?: No  Feeding Feeding Type: Formula Nipple Type: Slow - flow Length of feed: 30 min  LATCH Score/Interventions                      Lactation Tools Discussed/Used Pump Review: Setup, frequency, and cleaning;Milk Storage Initiated by:: Bedside RN. Date initiated:: 12/05/16   Consult Status Consult Status: Follow-up Date: 12/07/16 Follow-up type: In-patient    Andres Labrum 12/06/2016, 4:14 PM

## 2016-12-06 NOTE — Progress Notes (Signed)
POSTOPERATIVE DAY # 2 S/P Primary C/S   S:         Reports feeling well- ready to be discharged and spend time with baby in NICU              Tolerating po intake / no nausea / no vomiting / + flatus / no BM             Bleeding is light             Pain controlled with acetaminophen and ibuprofen (OTC)             Up ad lib / ambulatory/ voiding QS  Newborn formula feeding with pumping    O:  VS: BP (!) 136/92 (BP Location: Left Arm)   Pulse 90   Temp 98 F (36.7 C) (Oral)   Resp 19   Ht 5' 5"  (1.651 m)   Wt 107 kg (236 lb)   LMP 02/22/2016   SpO2 98%   Breastfeeding? Unknown   BMI 39.27 kg/m    LABS:               Recent Labs  12/04/16 0814 12/05/16 0526  WBC 4.3 5.6  HGB 12.2 10.8*  PLT 231 205               Bloodtype: --/--/B POS (06/05 0820)  Rubella: Immune (12/05 0000)                                             I&O: Intake/Output      06/06 0701 - 06/07 0700 06/07 0701 - 06/08 0700   I.V. (mL/kg)     Total Intake(mL/kg)     Urine (mL/kg/hr) 825 (0.3)    Blood     Total Output 825     Net -825                       Physical Exam:             Alert and Oriented X3  Abdomen: soft, non-tender, non-distended              Fundus: firm, non-tender, U/2             Dressing clean, dry, intact              Incision:  approximated with sutures / no erythema / no ecchymosis / no drainage  Perineum: intact  Lochia: light  Extremities: no Homans sign, moderate ankle and pedal edema, no calf pain or tenderness  A:        POD # 2 S/P C/S            Doing well stable status  P:        Routine postoperative care              Possible discharge this afternoon- dependent on status of newborn in NICU      Darrol Poke BSN, SNM  12/06/2016, 10:27 AM

## 2016-12-07 MED ORDER — OXYCODONE-ACETAMINOPHEN 5-325 MG PO TABS: 1.0000 | ORAL_TABLET | ORAL | 0 refills | Status: DC | PRN

## 2016-12-07 MED ORDER — FERROUS SULFATE 325 (65 FE) MG PO TABS: 325.0000 mg | ORAL_TABLET | Freq: Every day | ORAL | 3 refills | Status: DC

## 2016-12-07 NOTE — Discharge Instructions (Signed)
Home Care Instructions for Mom ACTIVITY  Gradually return to your regular activities.  Let yourself rest. Nap while your baby sleeps.  Avoid lifting anything that is heavier than 10 lb (4.5 kg) until your health care provider says it is okay.  Avoid activities that take a lot of effort and energy (are strenuous) until approved by your health care provider. Walking at a slow-to-moderate pace is usually safe.  If you had a cesarean delivery: ? Do not vacuum, climb stairs, or drive a car for 4-6 weeks. ? Have someone help you at home until you feel like you can do your usual activities yourself. ? Do exercises as told by your health care provider, if this applies.  VAGINAL BLEEDING You may continue to bleed for 4-6 weeks after delivery. Over time, the amount of blood usually decreases and the color of the blood usually gets lighter. However, the flow of bright red blood may increase if you have been too active. If you need to use more than one pad in an hour because your pad gets soaked, or if you pass a large clot:  Lie down.  Raise your feet.  Place a cold compress on your lower abdomen.  Rest.  Call your health care provider.  If you are breastfeeding, your period should return anytime between 8 weeks after delivery and the time that you stop breastfeeding. If you are not breastfeeding, your period should return 6-8 weeks after delivery. PERINEAL CARE The perineal area, or perineum, is the part of your body between your thighs. After delivery, this area needs special care. Follow these instructions as told by your health care provider.  Take warm tub baths for 15-20 minutes.  Use medicated pads and pain-relieving sprays and creams as told.  Do not use tampons or douches until vaginal bleeding has stopped.  Each time you go to the bathroom: ? Use a peri bottle. ? Change your pad. ? Use towelettes in place of toilet paper until your stitches have healed.  Do Kegel exercises  every day. Kegel exercises help to maintain the muscles that support the vagina, bladder, and bowels. You can do these exercises while you are standing, sitting, or lying down. To do Kegel exercises: ? Tighten the muscles of your abdomen and the muscles that surround your birth canal. ? Hold for a few seconds. ? Relax. ? Repeat until you have done this 5 times in a row.  To prevent hemorrhoids from developing or getting worse: ? Drink enough fluid to keep your urine clear or pale yellow. ? Avoid straining when having a bowel movement. ? Take over-the-counter medicines and stool softeners as told by your health care provider.  BREAST CARE  Wear a tight-fitting bra.  Avoid taking over-the-counter pain medicine for breast discomfort.  Apply ice to the breasts to help with discomfort as needed: ? Put ice in a plastic bag. ? Place a towel between your skin and the bag. ? Leave the ice on for 20 minutes or as told by your health care provider.  NUTRITION  Eat a well-balanced diet.  Do not try to lose weight quickly by cutting back on calories.  Take your prenatal vitamins until your postpartum checkup or until your health care provider tells you to stop.  POSTPARTUM DEPRESSION You may find yourself crying for no apparent reason and unable to cope with all of the changes that come with having a newborn. This mood is called postpartum depression. Postpartum depression happens because your hormone  levels change after delivery. If you have postpartum depression, get support from your partner, friends, and family. If the depression does not go away on its own after several weeks, contact your health care provider. BREAST SELF-EXAM Do a breast self-exam each month, at the same time of the month. If you are breastfeeding, check your breasts just after a feeding, when your breasts are less full. If you are breastfeeding and your period has started, check your breasts on day 5, 6, or 7 of your  period. Report any lumps, bumps, or discharge to your health care provider. Know that breasts are normally lumpy if you are breastfeeding. This is temporary, and it is not a health risk. INTIMACY AND SEXUALITY Avoid sexual activity for at least 3-4 weeks after delivery or until the brownish-red vaginal flow is completely gone. If you want to avoid pregnancy, use some form of birth control. You can get pregnant after delivery, even if you have not had your period. SEEK MEDICAL CARE IF:  You feel unable to cope with the changes that a child brings to your life, and these feelings do not go away after several weeks.  You notice a lump, a bump, or discharge on your breast.  SEEK IMMEDIATE MEDICAL CARE IF:  Blood soaks your pad in 1 hour or less.  You have: ? Severe pain or cramping in your lower abdomen. ? A bad-smelling vaginal discharge. ? A fever that is not controlled by medicine. ? A fever, and an area of your breast is red and sore. ? Pain or redness in your calf. ? Sudden, severe chest pain. ? Shortness of breath. ? Painful or bloody urination. ? Problems with your vision.  You vomit for 12 hours or longer.  You develop a severe headache.  You have serious thoughts about hurting yourself, your child, or anyone else.  This information is not intended to replace advice given to you by your health care provider. Make sure you discuss any questions you have with your health care provider. Document Released: 06/15/2000 Document Revised: 11/24/2015 Document Reviewed: 12/20/2014 Elsevier Interactive Patient Education  2017 [employer].

## 2016-12-07 NOTE — Discharge Summary (Signed)
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 Schedule an appointment as soon as possible for a visit in 6 week(s).   Specialty:  Obstetrics and Gynecology Contact information: [place] [address] [phone]           Newborn Data: Live born female  Birth Weight: 4 lb 12 oz (2155 g) APGAR: 8, 9  Home with mother.  Charyl Bigger 12/07/2016, 12:43 PM

## 2016-12-07 NOTE — Progress Notes (Signed)
Baby love nurse called and told about BP check in 2 days and one week. They will get the nurse out earliest on Monday they do not work on Sunday. Patient told how to look for signs and symptoms of high BP. Dr. Murvin Donning aware. She is ok with it being done on Monday.

## 2016-12-07 NOTE — Lactation Note (Signed)
This note was copied from a baby's chart. Lactation Consultation Note  Patient Name: Leslie Campbell [id number] Date: 12/07/2016 Reason for consult: Follow-up assessment;Infant < 6lbs Mom reports baby has not been going to breast. She has not been pumping consistently. Baby taking formula via bottle, Neosure 22 cal. Baby had been in NICU for hypglycemia but now back with Mom.  Mom reports she is interested in getting baby to breast. She has Medela PNS DEBP at home. LC advised Mom if she would like help with latching baby before d/c today we could demonstrate to her how to supplement at breast to encourage baby to latch and sustain latch. Also stressed importance of pumping every 3 hours for 15 minutes to encourage milk production, prevent  engorgement and protect milk supply. She would also have EBM to supplement. Advised Mom if she gets her milk in we can work on latch. Also discussed pump/bottle feeding. Since this is small baby recommended up to 30 minutes at breast if latching to conserve calorie usage with BF.  LC left phone number for Mom to call if she would like assist before d/c home. Advised of OP services and support group. Engorgement care discussed.  Maternal Data    Feeding Feeding Type: Formula Nipple Type: Slow - flow  LATCH Score/Interventions                      Lactation Tools Discussed/Used Tools: Pump Breast pump type: Double-Electric Breast Pump   Consult Status Consult Status: Follow-up Date: 12/07/16 Follow-up type: In-patient    Katrine Coho 12/07/2016, 11:01 AM

## 2017-01-17 ENCOUNTER — Encounter (HOSPITAL_COMMUNITY): Payer: Self-pay

## 2017-01-17 ENCOUNTER — Encounter (HOSPITAL_COMMUNITY)
Admission: RE | Admit: 2017-01-17 | Discharge: 2017-01-17 | Disposition: A | Discharge status: Home / Self Care | Payer: 59 | Source: Ambulatory Visit | Attending: Obstetrics and Gynecology | Admitting: Obstetrics and Gynecology

## 2017-01-17 ENCOUNTER — Other Ambulatory Visit: Payer: Self-pay | Admitting: Obstetrics and Gynecology

## 2017-01-17 ENCOUNTER — Other Ambulatory Visit: Payer: Self-pay

## 2017-01-17 DIAGNOSIS — Z01812 Encounter for preprocedural laboratory examination: Secondary | ICD-10-CM | POA: Insufficient documentation

## 2017-01-17 DIAGNOSIS — Z01818 Encounter for other preprocedural examination: Secondary | ICD-10-CM | POA: Insufficient documentation

## 2017-01-17 LAB — BASIC METABOLIC PANEL
ANION GAP: 5 (ref 5–15)
BUN: 15 mg/dL (ref 6–20)
CALCIUM: 9.1 mg/dL (ref 8.9–10.3)
CO2: 30 mmol/L (ref 22–32)
CREATININE: 1.21 mg/dL — AB (ref 0.44–1.00)
Chloride: 104 mmol/L (ref 101–111)
GFR, EST NON AFRICAN AMERICAN: 55 mL/min — AB (ref >60)
Glucose, Bld: 91 mg/dL (ref 65–99)
Potassium: 3.5 mmol/L (ref 3.5–5.1)
SODIUM: 139 mmol/L (ref 135–145)

## 2017-01-17 LAB — CBC
HCT: 37.8 % (ref 36.0–46.0)
HEMOGLOBIN: 12.9 g/dL (ref 12.0–15.0)
MCH: 31.4 pg (ref 26.0–34.0)
MCHC: 34.1 g/dL (ref 30.0–36.0)
MCV: 92 fL (ref 78.0–100.0)
PLATELETS: 243 10*3/uL (ref 150–400)
RBC: 4.11 MIL/uL (ref 3.87–5.11)
RDW: 13.3 % (ref 11.5–15.5)
WBC: 2.9 10*3/uL — AB (ref 4.0–10.5)

## 2017-01-17 NOTE — Patient Instructions (Addendum)
Your procedure is scheduled on:  Monday, July 23  Enter through the Main Entrance of [facility] at:  8:15 am  Pick up the phone at the desk and dial [phone].  Call this number if you have problems the morning of surgery: [phone].  Remember: Do NOT eat food or drink clear liquids (including water) after Midnight Sunday  Take these medicines the morning of surgery with a SIP OF WATER: labetalol  Stop herbal medications and supplements at this time.  Do NOT wear jewelry (body piercing), metal hair clips/bobby pins, make-up, or nail polish. Do NOT wear lotions, powders, or perfumes.  You may wear deoderant. Do NOT shave for 48 hours prior to surgery. Do NOT bring valuables to the hospital. Contacts may not be worn into surgery.  Have a responsible adult drive you home and stay with you for 24 hours after your procedure.  Home with husband Leslie Campbell cell [phone]

## 2017-01-21 ENCOUNTER — Encounter (HOSPITAL_COMMUNITY)
Admission: RE | Disposition: A | Discharge status: Home / Self Care | Payer: Self-pay | Source: Ambulatory Visit | Attending: Obstetrics and Gynecology

## 2017-01-21 ENCOUNTER — Ambulatory Visit (HOSPITAL_COMMUNITY): Payer: 59 | Admitting: Certified Registered Nurse Anesthetist

## 2017-01-21 ENCOUNTER — Ambulatory Visit (HOSPITAL_COMMUNITY)
Admission: RE | Admit: 2017-01-21 | Discharge: 2017-01-21 | Disposition: A | Discharge status: Home / Self Care | Payer: 59 | Source: Ambulatory Visit | Attending: Obstetrics and Gynecology | Admitting: Obstetrics and Gynecology

## 2017-01-21 DIAGNOSIS — Z302 Encounter for sterilization: Secondary | ICD-10-CM | POA: Diagnosis not present

## 2017-01-21 DIAGNOSIS — E282 Polycystic ovarian syndrome: Secondary | ICD-10-CM | POA: Insufficient documentation

## 2017-01-21 DIAGNOSIS — E785 Hyperlipidemia, unspecified: Secondary | ICD-10-CM | POA: Insufficient documentation

## 2017-01-21 DIAGNOSIS — N736 Female pelvic peritoneal adhesions (postinfective): Secondary | ICD-10-CM | POA: Diagnosis not present

## 2017-01-21 DIAGNOSIS — Z79899 Other long term (current) drug therapy: Secondary | ICD-10-CM | POA: Insufficient documentation

## 2017-01-21 DIAGNOSIS — G43909 Migraine, unspecified, not intractable, without status migrainosus: Secondary | ICD-10-CM | POA: Diagnosis not present

## 2017-01-21 DIAGNOSIS — I10 Essential (primary) hypertension: Secondary | ICD-10-CM | POA: Diagnosis not present

## 2017-01-21 DIAGNOSIS — E229 Hyperfunction of pituitary gland, unspecified: Secondary | ICD-10-CM | POA: Diagnosis not present

## 2017-01-21 HISTORY — PX: LAPAROSCOPIC TUBAL LIGATION: SHX1937

## 2017-01-21 HISTORY — PX: LAPAROSCOPIC LYSIS OF ADHESIONS: SHX5905

## 2017-01-21 SURGERY — LIGATION, FALLOPIAN TUBE, LAPAROSCOPIC
Anesthesia: General | Laterality: Bilateral

## 2017-01-21 MED ORDER — HYDRALAZINE HCL 20 MG/ML IJ SOLN
INTRAMUSCULAR | Status: AC
  Administered 2017-01-21: 10 mg via INTRAVENOUS
  Filled 2017-01-21: qty 1

## 2017-01-21 MED ORDER — MIDAZOLAM HCL 2 MG/2ML IJ SOLN
INTRAMUSCULAR | Status: AC
  Filled 2017-01-21: qty 2

## 2017-01-21 MED ORDER — SCOPOLAMINE 1 MG/3DAYS TD PT72
MEDICATED_PATCH | TRANSDERMAL | Status: AC
  Filled 2017-01-21: qty 1

## 2017-01-21 MED ORDER — CEFAZOLIN SODIUM-DEXTROSE 2-4 GM/100ML-% IV SOLN
2.0000 g | INTRAVENOUS | Status: AC
  Administered 2017-01-21: 2 g via INTRAVENOUS

## 2017-01-21 MED ORDER — FENTANYL CITRATE (PF) 250 MCG/5ML IJ SOLN
INTRAMUSCULAR | Status: AC
  Filled 2017-01-21: qty 5

## 2017-01-21 MED ORDER — HYDROMORPHONE HCL 1 MG/ML IJ SOLN
0.2500 mg | INTRAMUSCULAR | Status: DC | PRN
  Administered 2017-01-21: 0.5 mg via INTRAVENOUS

## 2017-01-21 MED ORDER — MIDAZOLAM HCL 2 MG/2ML IJ SOLN
INTRAMUSCULAR | Status: DC | PRN
Start: 2017-01-21 — End: 2017-01-21
  Administered 2017-01-21: 2 mg via INTRAVENOUS

## 2017-01-21 MED ORDER — PROPOFOL 10 MG/ML IV BOLUS
INTRAVENOUS | Status: DC | PRN
  Administered 2017-01-21: 200 mg via INTRAVENOUS

## 2017-01-21 MED ORDER — LACTATED RINGERS IV SOLN
INTRAVENOUS | Status: DC
  Administered 2017-01-21 (×2): via INTRAVENOUS

## 2017-01-21 MED ORDER — OXYCODONE HCL 5 MG PO TABS: 5.0000 mg | ORAL_TABLET | Freq: Once | ORAL | Status: DC | PRN

## 2017-01-21 MED ORDER — ROCURONIUM BROMIDE 100 MG/10ML IV SOLN
INTRAVENOUS | Status: DC | PRN
  Administered 2017-01-21: 50 mg via INTRAVENOUS

## 2017-01-21 MED ORDER — ROCURONIUM BROMIDE 100 MG/10ML IV SOLN
INTRAVENOUS | Status: AC
  Filled 2017-01-21: qty 1

## 2017-01-21 MED ORDER — LIDOCAINE HCL (CARDIAC) 20 MG/ML IV SOLN
INTRAVENOUS | Status: AC
  Filled 2017-01-21: qty 5

## 2017-01-21 MED ORDER — ONDANSETRON HCL 4 MG/2ML IJ SOLN: 4.0000 mg | Freq: Four times a day (QID) | INTRAMUSCULAR | Status: DC | PRN

## 2017-01-21 MED ORDER — SUGAMMADEX SODIUM 500 MG/5ML IV SOLN
INTRAVENOUS | Status: AC
  Filled 2017-01-21: qty 5

## 2017-01-21 MED ORDER — HYDROMORPHONE HCL 1 MG/ML IJ SOLN
INTRAMUSCULAR | Status: AC
  Filled 2017-01-21: qty 0.5

## 2017-01-21 MED ORDER — PROPOFOL 10 MG/ML IV BOLUS
INTRAVENOUS | Status: AC
  Filled 2017-01-21: qty 20

## 2017-01-21 MED ORDER — HYDRALAZINE HCL 20 MG/ML IJ SOLN
10.0000 mg | Freq: Once | INTRAMUSCULAR | Status: AC
  Administered 2017-01-21: 10 mg via INTRAVENOUS

## 2017-01-21 MED ORDER — FENTANYL CITRATE (PF) 100 MCG/2ML IJ SOLN
INTRAMUSCULAR | Status: DC | PRN
  Administered 2017-01-21: 50 ug via INTRAVENOUS
  Administered 2017-01-21: 100 ug via INTRAVENOUS

## 2017-01-21 MED ORDER — BUPIVACAINE HCL (PF) 0.25 % IJ SOLN
INTRAMUSCULAR | Status: DC | PRN
  Administered 2017-01-21: 10 mL

## 2017-01-21 MED ORDER — SODIUM CHLORIDE 0.9 % IJ SOLN
INTRAMUSCULAR | Status: AC
  Filled 2017-01-21: qty 10

## 2017-01-21 MED ORDER — ONDANSETRON HCL 4 MG/2ML IJ SOLN
INTRAMUSCULAR | Status: DC | PRN
  Administered 2017-01-21: 4 mg via INTRAVENOUS

## 2017-01-21 MED ORDER — GLYCOPYRROLATE 0.2 MG/ML IJ SOLN
INTRAMUSCULAR | Status: AC
  Filled 2017-01-21: qty 1

## 2017-01-21 MED ORDER — LIDOCAINE HCL (CARDIAC) 20 MG/ML IV SOLN
INTRAVENOUS | Status: DC | PRN
  Administered 2017-01-21: 100 mg via INTRAVENOUS

## 2017-01-21 MED ORDER — SUGAMMADEX SODIUM 500 MG/5ML IV SOLN
INTRAVENOUS | Status: DC | PRN
  Administered 2017-01-21: 500 mg via INTRAVENOUS

## 2017-01-21 MED ORDER — OXYCODONE-ACETAMINOPHEN 5-325 MG PO TABS: 1.0000 | ORAL_TABLET | ORAL | 0 refills | Status: DC | PRN

## 2017-01-21 MED ORDER — BUPIVACAINE HCL (PF) 0.25 % IJ SOLN
INTRAMUSCULAR | Status: AC
  Filled 2017-01-21: qty 30

## 2017-01-21 MED ORDER — SCOPOLAMINE 1 MG/3DAYS TD PT72
1.0000 | MEDICATED_PATCH | Freq: Once | TRANSDERMAL | Status: DC
  Administered 2017-01-21: 1.5 mg via TRANSDERMAL

## 2017-01-21 MED ORDER — ONDANSETRON HCL 4 MG/2ML IJ SOLN
INTRAMUSCULAR | Status: AC
  Filled 2017-01-21: qty 2

## 2017-01-21 MED ORDER — OXYCODONE HCL 5 MG/5ML PO SOLN: 5.0000 mg | Freq: Once | ORAL | Status: DC | PRN

## 2017-01-21 MED ORDER — GLYCOPYRROLATE 0.2 MG/ML IJ SOLN
INTRAMUSCULAR | Status: DC | PRN
  Administered 2017-01-21: 0.1 mg via INTRAVENOUS

## 2017-01-21 MED ORDER — DEXAMETHASONE SODIUM PHOSPHATE 10 MG/ML IJ SOLN
INTRAMUSCULAR | Status: DC | PRN
  Administered 2017-01-21: 10 mg via INTRAVENOUS

## 2017-01-21 MED ORDER — DEXAMETHASONE SODIUM PHOSPHATE 10 MG/ML IJ SOLN
INTRAMUSCULAR | Status: AC
  Filled 2017-01-21: qty 1

## 2017-01-21 SURGICAL SUPPLY — 24 items
ADH SKN CLS APL DERMABOND .7 (GAUZE/BANDAGES/DRESSINGS) ×2
CATH ROBINSON RED A/P 16FR (CATHETERS) ×4 IMPLANT
CLOTH BEACON ORANGE TIMEOUT ST (SAFETY) ×4 IMPLANT
DERMABOND ADVANCED (GAUZE/BANDAGES/DRESSINGS) ×2
DERMABOND ADVANCED .7 DNX12 (GAUZE/BANDAGES/DRESSINGS) ×2 IMPLANT
DRSG OPSITE POSTOP 3X4 (GAUZE/BANDAGES/DRESSINGS) ×2 IMPLANT
GLOVE BIO SURGEON STRL SZ7.5 (GLOVE) ×8 IMPLANT
GLOVE BIOGEL PI IND STRL 7.0 (GLOVE) ×4 IMPLANT
GLOVE BIOGEL PI INDICATOR 7.0 (GLOVE) ×4
GOWN STRL REUS W/TWL LRG LVL3 (GOWN DISPOSABLE) ×8 IMPLANT
NEEDLE INSUFFLATION 120MM (ENDOMECHANICALS) ×4 IMPLANT
PACK LAPAROSCOPY BASIN (CUSTOM PROCEDURE TRAY) ×4 IMPLANT
PACK TRENDGUARD 450 HYBRID PRO (MISCELLANEOUS) IMPLANT
PACK TRENDGUARD 600 HYBRD PROC (MISCELLANEOUS) IMPLANT
PROTECTOR NERVE ULNAR (MISCELLANEOUS) ×8 IMPLANT
SOLUTION ELECTROLUBE (MISCELLANEOUS) ×4 IMPLANT
SUT VICRYL 0 UR6 27IN ABS (SUTURE) ×4 IMPLANT
SUT VICRYL 4-0 PS2 18IN ABS (SUTURE) IMPLANT
TOWEL OR 17X24 6PK STRL BLUE (TOWEL DISPOSABLE) ×8 IMPLANT
TRENDGUARD 450 HYBRID PRO PACK (MISCELLANEOUS) ×4
TRENDGUARD 600 HYBRID PROC PK (MISCELLANEOUS)
TROCAR OPTI TIP 5M 100M (ENDOMECHANICALS) IMPLANT
TROCAR XCEL DIL TIP R 11M (ENDOMECHANICALS) ×4 IMPLANT
WARMER LAPAROSCOPE (MISCELLANEOUS) ×4 IMPLANT

## 2017-01-21 NOTE — Discharge Instructions (Signed)

## 2017-01-21 NOTE — Anesthesia Postprocedure Evaluation (Signed)
Anesthesia Post Note  Patient: Leslie Campbell  Procedure(s) Performed: Procedure(s) (LRB): LAPAROSCOPIC TUBAL LIGATION (Bilateral) LAPAROSCOPIC LYSIS OF ADHESIONS     Patient location during evaluation: PACU Anesthesia Type: General Level of consciousness: awake and alert and patient cooperative Pain management: pain level controlled Vital Signs Assessment: post-procedure vital signs reviewed and stable Respiratory status: spontaneous breathing and respiratory function stable Cardiovascular status: stable Anesthetic complications: no    Last Vitals:  Vitals:   01/21/17 1245 01/21/17 1300  BP: (!) 154/101   Pulse: 63 62  Resp: 16 (!) 21  Temp:      Last Pain:  Vitals:   01/21/17 0835  TempSrc: Oral   Pain Goal: Patients Stated Pain Goal: 5 (01/21/17 0835)               [place]

## 2017-01-21 NOTE — Anesthesia Procedure Notes (Signed)
Procedure Name: Intubation Date/Time: 01/21/2017 10:39 AM Performed by: Flossie Dibble Pre-anesthesia Checklist: Emergency Drugs available, Suction available, Patient being monitored, Timeout performed and Patient identified Patient Re-evaluated:Patient Re-evaluated prior to induction Oxygen Delivery Method: Circle system utilized Preoxygenation: Pre-oxygenation with 100% oxygen Induction Type: IV induction Ventilation: Mask ventilation with difficulty and Oral airway inserted - appropriate to patient size Laryngoscope Size: Mac and 4 Grade View: Grade I Tube type: Oral Number of attempts: 1 Airway Equipment and Method: Stylet Secured at: 22 cm Tube secured with: Tape Dental Injury: Teeth and Oropharynx as per pre-operative assessment

## 2017-01-21 NOTE — Transfer of Care (Signed)
Immediate Anesthesia Transfer of Care Note  Patient: Leslie Campbell  Procedure(s) Performed: Procedure(s): LAPAROSCOPIC TUBAL LIGATION (Bilateral) LAPAROSCOPIC LYSIS OF ADHESIONS  Patient Location: PACU  Anesthesia Type:General  Level of Consciousness: awake, alert  and oriented  Airway & Oxygen Therapy: Patient Spontanous Breathing and Patient connected to nasal cannula oxygen  Post-op Assessment: Report given to RN and Post -op Vital signs reviewed and stable  Post vital signs: Reviewed and stable  Last Vitals:  Vitals:   01/21/17 0835  BP: 134/88  Pulse: 70  Resp: 16  Temp: 36.4 C    Last Pain:  Vitals:   01/21/17 0835  TempSrc: Oral      Patients Stated Pain Goal: 5 (23/34/35 6861)  Complications: No apparent anesthesia complications

## 2017-01-21 NOTE — Op Note (Signed)
01/21/2017  10:51 AM  PATIENT:  Leslie Campbell  41 y.o. female  PRE-OPERATIVE DIAGNOSIS:  Desires Sterilization  POST-OPERATIVE DIAGNOSIS:  Desires Sterilization  PROCEDURE:  Procedure(s): LAPAROSCOPIC TUBAL LIGATION LAPAROSCOPIC LYSIS OF ADHESIONS  SURGEON:  Surgeon(s): Brien Few, MD  ASSISTANTS: none   ANESTHESIA:   local and general  ESTIMATED BLOOD LOSS: minimal  DRAINS: none   LOCAL MEDICATIONS USED:  MARCAINE    and Amount: 10 ml  SPECIMEN:  No Specimen  DISPOSITION OF SPECIMEN:  N/A  COUNTS:  YES  DICTATION #: [id number]  PLAN OF CARE: dc home  PATIENT DISPOSITION:  PACU - hemodynamically stable.

## 2017-01-21 NOTE — H&P (Signed)
Leslie Campbell is an 41 y.o. female. Presents for elective sterilization  Pertinent Gynecological History: Menses: flow is light Bleeding: na Contraception: none DES exposure: denies Blood transfusions: none Sexually transmitted diseases: no past history Previous GYN Procedures: DNC  Last mammogram: normal Date: 2017 Last pap: normal Date: 2018 OB History: G3, P1   Menstrual History: Menarche age: 41 Patient's last menstrual period was 01/07/2017.    Past Medical History:  Diagnosis Date  . HTN (hypertension)   . Hyperlipidemia    no meds  . Infertility associated with anovulation   . Infertility, female    clomid  . Migraine    otc med prn  . Miscarriage 01/2010  . Miscarriage 2012   x 5 - only 1 required surgery  . PCOS (polycystic ovarian syndrome)   . 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 CCY    . CERVICAL CERCLAGE N/A 06/22/2016   Procedure: Linna Caprice CERVICAL;  Surgeon: Brien Few, MD;  Location: [facility];  Service: Gynecology;  Laterality: N/A;  EDD: 12/25/16  . CESAREAN SECTION N/A 12/04/2016   Procedure: CESAREAN SECTION;  Surgeon: Brien Few, MD;  Location: [facility];  Service: Obstetrics;  Laterality: N/A;  . CHOLECYSTECTOMY    . DILATION AND CURETTAGE OF UTERUS     MAB  . LAPAROSCOPIC OVARIAN CYSTECTOMY Bilateral   . miscarriage  01/2010  . WISDOM TOOTH EXTRACTION      Family History  Problem Relation Age of Onset  . Hypertension Mother   . Hypertension Father   . Diabetes Brother   . Diabetes Other     Social History:  reports that she has never smoked. She has never used smokeless tobacco. She reports that she does not drink alcohol or use drugs.  Allergies: No Known Allergies  Prescriptions Prior to Admission  Medication Sig Dispense Refill Last Dose  . hydrochlorothiazide (HYDRODIURIL) 25 MG tablet Take 25 mg by mouth daily.   01/20/2017 at Unknown time  . labetalol  (NORMODYNE) 200 MG tablet Take 400 mg by mouth 3 (three) times daily.   01/21/2017 at Unknown time  . Multiple Vitamins-Minerals (MULTIVITAMIN WITH MINERALS) tablet Take 1 tablet by mouth daily.   Past Week at Unknown time    Review of Systems  Constitutional: Negative.   All other systems reviewed and are negative.   Last menstrual period 01/07/2017, not currently breastfeeding. Physical Exam  Nursing note and vitals reviewed. Constitutional: She is oriented to person, place, and time. She appears well-developed and well-nourished.  HENT:  Head: Normocephalic and atraumatic.  Neck: Normal range of motion. Neck supple.  Cardiovascular: Normal rate and regular rhythm.   Respiratory: Effort normal and breath sounds normal.  GI: Soft. Bowel sounds are normal.  Genitourinary: Vagina normal and uterus normal.  Musculoskeletal: Normal range of motion.  Neurological: She is alert and oriented to person, place, and time. She has normal reflexes.  Skin: Skin is warm and dry.  Psychiatric: She has a normal mood and affect.    No results found for this or any previous visit (from the past 24 hour(s)).  No results found.  Assessment/Plan: Elective Sterilization Consent done. RIsks vs benefits of surgery discussed with pt. Failure risks of TL noted. LTL  Deshone Lyssy J 01/21/2017, 8:37 AM

## 2017-01-21 NOTE — Anesthesia Preprocedure Evaluation (Signed)
Anesthesia Evaluation  Patient identified by MRN, date of birth, ID band Patient awake    Reviewed: Allergy & Precautions, H&P , NPO status , Patient's Chart, lab work & pertinent test results  Airway Mallampati: II   Neck ROM: full    Dental   Pulmonary neg pulmonary ROS,    breath sounds clear to auscultation       Cardiovascular hypertension,  Rhythm:regular Rate:Normal     Neuro/Psych  Headaches,    GI/Hepatic   Endo/Other  PCOS  Renal/GU      Musculoskeletal   Abdominal   Peds  Hematology   Anesthesia Other Findings   Reproductive/Obstetrics                             Anesthesia Physical Anesthesia Plan  ASA: II  Anesthesia Plan: General   Post-op Pain Management:    Induction: Intravenous  PONV Risk Score and Plan: 4 or greater and Ondansetron, Dexamethasone, Propofol, Midazolam, Scopolamine patch - Pre-op and Treatment may vary due to age or medical condition  Airway Management Planned: Oral ETT  Additional Equipment:   Intra-op Plan:   Post-operative Plan: Extubation in OR  Informed Consent: I have reviewed the patients History and Physical, chart, labs and discussed the procedure including the risks, benefits and alternatives for the proposed anesthesia with the patient or authorized representative who has indicated his/her understanding and acceptance.     Plan Discussed with: CRNA, Anesthesiologist and Surgeon  Anesthesia Plan Comments:         Anesthesia Quick Evaluation

## 2017-01-21 NOTE — Progress Notes (Signed)
Patient seen and examined. Consent witnessed and signed. No changes noted. Update completed. Patient ID: Leslie Campbell, female   DOB: 1975-08-22, 41 y.o.   MRN: [id number]

## 2017-01-22 ENCOUNTER — Encounter (HOSPITAL_COMMUNITY): Payer: Self-pay | Admitting: Obstetrics and Gynecology

## 2017-01-22 NOTE — Op Note (Signed)
NAMESHERI, GATCHEL NO.:  [id number]  MEDICAL RECORD NO.:  [id number]  LOCATION:                                 FACILITY:  PHYSICIAN:  Lovenia Kim, M.D.     DATE OF BIRTH:  DATE OF PROCEDURE: DATE OF DISCHARGE:                              OPERATIVE REPORT   PREOPERATIVE DIAGNOSIS:  Desire for elective sterilization.  POSTOPERATIVE DIAGNOSES:  Desire for elective sterilization, pelvic adhesions.  PROCEDURES:  Diagnostic laparoscopy, laparoscopic tubal ligation with bipolar cautery, laparoscopic lysis of adhesions.  SURGEON:  Lovenia Kim, M.D.  ASSISTANT:  None.  ANESTHESIA:  General and local.  ESTIMATED BLOOD LOSS:  Less than 50 mL.  COMPLICATIONS:  None.  DRAINS:  None.  COUNTS:  Correct.  DISPOSITION:  The patient to recovery room in good condition.  BRIEF OPERATIVE NOTE:  After being apprised of risks of anesthesia, infection, bleeding, injury to surrounding organs, possible need for repair, delayed versus immediate complications to include bowel and bladder injury, possible need for repair, the patient was brought into the operating room where she was administered general anesthetic without complications.  Prepped and draped in usual sterile fashion. Catheterized until the bladder was empty.  Hulka tenaculum placed vaginally without difficulty.  Infraumbilical incision made with a scalpel.  Veress needle placed with opening pressure of -4.  3 L CO2 insufflated without difficulty.  Trocar placed atraumatically. Visualization reveals normal atraumatic trocar entry.  Normal liver, gallbladder area, normal appendiceal area, normal uterus with normal anterior and posterior cul-de-sac.  Anterior abdominal wall adhesions to the left adnexa.  Kleppinger bipolar cautery entered through the operative port.  Adhesions were lysed at the level of the abdominal wall and cut using sharp dissection with hook scissors.  Right tube  traced out to its fimbriated end and ampullary-isthmic portion of the tube was cauterized in 3 contiguous areas using bipolar cautery to resistance of 0 and divided using hook scissors.  Tubal lumens were visualized.  The same exact procedure was done on the left tube.  Tubal lumen was also visualized after cutting this tube as well.  Bilateral normal ovaries were noted as well.  Pictures were taken.  At this time, all instruments were removed.  CO2 was released.  Positive pressure x3 was applied. Trocar removed atraumatically.  Incision was closed using 0 Vicryl, 4-0 Vicryl, and Dermabond.  Dilute Marcaine solution placed.  The patient tolerated the procedure well, was transferred to recovery in good condition after removal of all instruments from vagina.     Lovenia Kim, M.D.     RJT/MEDQ  D:  01/21/2017  T:  01/22/2017  Job:  [id number]  cc:   Lovenia Kim, M.D. Fax: [phone]

## 2017-02-06 ENCOUNTER — Encounter: Payer: 59 | Admitting: Family Medicine

## 2017-03-13 ENCOUNTER — Encounter: Payer: Self-pay | Admitting: Family Medicine

## 2017-03-13 ENCOUNTER — Ambulatory Visit (INDEPENDENT_AMBULATORY_CARE_PROVIDER_SITE_OTHER): Payer: 59 | Admitting: Family Medicine

## 2017-03-13 VITALS — BP 130/80 | HR 75 | Temp 98.2°F | Ht 64.25 in | Wt 210.2 lb

## 2017-03-13 DIAGNOSIS — G5600 Carpal tunnel syndrome, unspecified upper limb: Secondary | ICD-10-CM | POA: Insufficient documentation

## 2017-03-13 DIAGNOSIS — I10 Essential (primary) hypertension: Secondary | ICD-10-CM | POA: Diagnosis not present

## 2017-03-13 DIAGNOSIS — Z23 Encounter for immunization: Secondary | ICD-10-CM

## 2017-03-13 DIAGNOSIS — Z0000 Encounter for general adult medical examination without abnormal findings: Secondary | ICD-10-CM | POA: Diagnosis not present

## 2017-03-13 DIAGNOSIS — Z6835 Body mass index (BMI) 35.0-35.9, adult: Secondary | ICD-10-CM | POA: Diagnosis not present

## 2017-03-13 DIAGNOSIS — E282 Polycystic ovarian syndrome: Secondary | ICD-10-CM | POA: Diagnosis not present

## 2017-03-13 DIAGNOSIS — E7800 Pure hypercholesterolemia, unspecified: Secondary | ICD-10-CM

## 2017-03-13 MED ORDER — METOPROLOL SUCCINATE ER 50 MG PO TB24: 50.0000 mg | ORAL_TABLET | Freq: Every day | ORAL | 11 refills | Status: DC

## 2017-03-13 MED ORDER — HYDROCHLOROTHIAZIDE 25 MG PO TABS: 25.0000 mg | ORAL_TABLET | Freq: Every day | ORAL | 3 refills | Status: DC

## 2017-03-13 NOTE — Patient Instructions (Addendum)
Make a goal of 64 oz of fluids per day (mostly water)  Stay away from soda   At your next gyn appt -discuss a first mammogram   Flu shot today   Let's switch from labetalol to metoprolol er 50 mg daily  If blood pressure stays high -over 140/90 -let me know and we will increase dose (if side effects let us know also)   Wear your wrist splints  Take ibuprofen over the counter if needed   Call us back to request neurology referral for carpal tunnel in the future   Follow up in 6 months

## 2017-03-13 NOTE — Progress Notes (Signed)
Subjective:    Patient ID: Leslie Campbell, female    DOB: 1975-10-16, 41 y.o.   MRN: [id number]  HPI  Here for health maintenance exam and to review chronic medical problems   Doing well  Feeling good  Taking care of herself   Had a baby - 3 months  Already sleeping through the night     Wt Readings from Last 3 Encounters:  03/13/17 210 lb 4 oz (95.4 kg)  01/17/17 207 lb (93.9 kg)  12/04/16 236 lb (107 kg)  not able to start exercise yet  Is back to work- eating fairly   35.81 kg/m   Needs a form filled out of for work for PE   Pap 2/12 Menses- irregular after having a baby  Had a tubal ligation this year Hx of PCOS   Mammogram -has not had one yet Will disc at her gyn appt Self exam - no lumps   Flu shot- will get one today   Tdap 4/18  bp is stable today  No cp or palpitations or headaches or edema  No side effects to medicines  BP Readings from Last 3 Encounters:  03/13/17 130/80  01/21/17 130/85  01/17/17 (!) 126/94     Takes labetalol 400 mg bid - would like to switch to daily /no longer going to be pregnant  Also hctz 25 mg daily   Hyperlipidemia Lab Results  Component Value Date   CHOL 195 04/19/2014   HDL 42.40 04/19/2014   LDLCALC 140 (H) 04/19/2014   LDLDIRECT 169.5 08/03/2008   TRIG 64.0 04/19/2014   CHOLHDL 5 04/19/2014   Lab Results  Component Value Date   CREATININE 1.21 (H) 01/17/2017   BUN 15 01/17/2017   NA 139 01/17/2017   K 3.5 01/17/2017   CL 104 01/17/2017   CO2 30 01/17/2017   Lab Results  Component Value Date   WBC 2.9 (L) 01/17/2017   HGB 12.9 01/17/2017   HCT 37.8 01/17/2017   MCV 92.0 01/17/2017   PLT 243 01/17/2017   Does not drink enough fluids        Review of Systems  Constitutional: Negative for activity change, appetite change, fatigue, fever and unexpected weight change.  HENT: Negative for congestion, ear pain, rhinorrhea, sinus pressure and sore throat.   Eyes: Negative for pain, redness and  visual disturbance.  Respiratory: Negative for cough, shortness of breath and wheezing.   Cardiovascular: Negative for chest pain and palpitations.  Gastrointestinal: Negative for abdominal pain, blood in stool, constipation and diarrhea.  Endocrine: Negative for polydipsia and polyuria.  Genitourinary: Negative for dysuria, frequency and urgency.  Musculoskeletal: Negative for arthralgias, back pain and myalgias.  Skin: Negative for pallor and rash.  Allergic/Immunologic: Negative for environmental allergies.  Neurological: Negative for dizziness, syncope and headaches.       Pos for hand numbness /tingling and pain from CTS  Hematological: Negative for adenopathy. Does not bruise/bleed easily.  Psychiatric/Behavioral: Negative for decreased concentration and dysphoric mood. The patient is not nervous/anxious.        Objective:   Physical Exam  Constitutional: She appears well-developed and well-nourished. No distress.  obese and well appearing   HENT:  Head: Normocephalic and atraumatic.  Right Ear: External ear normal.  Left Ear: External ear normal.  Nose: Nose normal.  Mouth/Throat: Oropharynx is clear and moist.  Eyes: Pupils are equal, round, and reactive to light. Conjunctivae and EOM are normal. Right eye exhibits no discharge. Left eye exhibits  no discharge. No scleral icterus.  Neck: Normal range of motion. Neck supple. No JVD present. Carotid bruit is not present. No thyromegaly present.  Cardiovascular: Normal rate, regular rhythm, normal heart sounds and intact distal pulses.  Exam reveals no gallop.   Pulmonary/Chest: Effort normal and breath sounds normal. No respiratory distress. She has no wheezes. She has no rales.  Abdominal: Soft. Bowel sounds are normal. She exhibits no distension and no mass. There is no tenderness.  CS scar is healed   Musculoskeletal: She exhibits no edema or tenderness.  Lymphadenopathy:    She has no cervical adenopathy.  Neurological:  She is alert. She has normal reflexes. No cranial nerve deficit. She exhibits normal muscle tone. Coordination normal.  Skin: Skin is warm and dry. No rash noted. No erythema. No pallor.  Few skin tags   Psychiatric: She has a normal mood and affect.          Assessment & Plan:   Problem List Items Addressed This Visit      Cardiovascular and Mediastinum   HYPERTENSION, BENIGN ESSENTIAL - Primary    bp in fair control at this time  BP Readings from Last 1 Encounters:  03/13/17 130/80   No changes needed Disc lifstyle change with low sodium diet and exercise  Labs reviewed (some were recent from gyn)  Wellness labs ordered       Relevant Medications   metoprolol succinate (TOPROL-XL) 50 MG 24 hr tablet   hydrochlorothiazide (HYDRODIURIL) 25 MG tablet   Other Relevant Orders   CBC with Differential/Platelet   Comprehensive metabolic panel   Lipid panel   TSH     Endocrine   POLYCYSTIC OVARIAN DISEASE    Doing well currently s/o 2nd child  Had BTL Not on hormonal therapy Menses are still mildly irregular         Other   Hyperlipidemia    Lipid panel today  Disc goals for lipids and reasons to control them Rev labs with pt (from last draw)  Handout for diet given  Rev low sat fat diet in detail       Relevant Medications   metoprolol succinate (TOPROL-XL) 50 MG 24 hr tablet   hydrochlorothiazide (HYDRODIURIL) 25 MG tablet   Obesity    Discussed how this problem influences overall health and the risks it imposes  Reviewed plan for weight loss with lower calorie diet (via better food choices and also portion control or program like weight watchers) and exercise building up to or more than 30 minutes 5 days per week including some aerobic activity         Routine general medical examination at a health care facility    Reviewed health habits including diet and exercise and skin cancer prevention Reviewed appropriate screening tests for age  Also reviewed  health mt list, fam hx and immunization status , as well as social and family history   See HPI She will ask gyn for order for mammogram/ first  Sent for recent pap test  Flu shot today  Change beta blocker to metoprolol       Other Visit Diagnoses    Need for influenza vaccination       Relevant Orders   Flu Vaccine QUAD 6+ mos PF IM (Fluarix Quad PF) (Completed)

## 2017-03-14 NOTE — Assessment & Plan Note (Signed)
Reviewed health habits including diet and exercise and skin cancer prevention Reviewed appropriate screening tests for age  Also reviewed health mt list, fam hx and immunization status , as well as social and family history   See HPI She will ask gyn for order for mammogram/ first  Sent for recent pap test  Flu shot today  Change beta blocker to metoprolol

## 2017-03-14 NOTE — Assessment & Plan Note (Signed)
Lipid panel today  Disc goals for lipids and reasons to control them Rev labs with pt (from last draw)  Handout for diet given  Rev low sat fat diet in detail

## 2017-03-14 NOTE — Assessment & Plan Note (Signed)
Discussed how this problem influences overall health and the risks it imposes  Reviewed plan for weight loss with lower calorie diet (via better food choices and also portion control or program like weight watchers) and exercise building up to or more than 30 minutes 5 days per week including some aerobic activity    

## 2017-03-14 NOTE — Assessment & Plan Note (Signed)
bp in fair control at this time  BP Readings from Last 1 Encounters:  03/13/17 130/80   No changes needed Disc lifstyle change with low sodium diet and exercise  Labs reviewed (some were recent from gyn)  Wellness labs ordered

## 2017-03-14 NOTE — Assessment & Plan Note (Signed)
Doing well currently s/o 2nd child  Had BTL Not on hormonal therapy Menses are still mildly irregular

## 2017-03-15 ENCOUNTER — Encounter: Payer: Self-pay | Admitting: *Deleted

## 2017-03-15 LAB — CBC WITH DIFFERENTIAL/PLATELET
BASOS ABS: 0 10*3/uL (ref 0.0–0.2)
Basos: 1 %
EOS (ABSOLUTE): 0.2 10*3/uL (ref 0.0–0.4)
Eos: 7 %
Hematocrit: 39.7 % (ref 34.0–46.6)
Hemoglobin: 13.5 g/dL (ref 11.1–15.9)
IMMATURE GRANULOCYTES: 0 %
Immature Grans (Abs): 0 10*3/uL (ref 0.0–0.1)
Lymphocytes Absolute: 1.8 10*3/uL (ref 0.7–3.1)
Lymphs: 53 %
MCH: 30.5 pg (ref 26.6–33.0)
MCHC: 34 g/dL (ref 31.5–35.7)
MCV: 90 fL (ref 79–97)
MONOCYTES: 9 %
Monocytes Absolute: 0.3 10*3/uL (ref 0.1–0.9)
NEUTROS ABS: 1 10*3/uL — AB (ref 1.4–7.0)
NEUTROS PCT: 30 %
PLATELETS: 300 10*3/uL (ref 150–379)
RBC: 4.42 x10E6/uL (ref 3.77–5.28)
RDW: 14.7 % (ref 12.3–15.4)
WBC: 3.3 10*3/uL — ABNORMAL LOW (ref 3.4–10.8)

## 2017-03-15 LAB — COMPREHENSIVE METABOLIC PANEL
A/G RATIO: 1.7 (ref 1.2–2.2)
ALT: 32 IU/L (ref 0–32)
AST: 26 IU/L (ref 0–40)
Albumin: 4.3 g/dL (ref 3.5–5.5)
Alkaline Phosphatase: 74 IU/L (ref 39–117)
BUN/Creatinine Ratio: 11 (ref 9–23)
BUN: 12 mg/dL (ref 6–24)
Bilirubin Total: 0.4 mg/dL (ref 0.0–1.2)
CALCIUM: 9.1 mg/dL (ref 8.7–10.2)
CO2: 24 mmol/L (ref 20–29)
CREATININE: 1.06 mg/dL — AB (ref 0.57–1.00)
Chloride: 99 mmol/L (ref 96–106)
GFR calc non Af Amer: 66 mL/min/{1.73_m2} (ref >59)
GFR, EST AFRICAN AMERICAN: 76 mL/min/{1.73_m2} (ref >59)
GLUCOSE: 93 mg/dL (ref 65–99)
Globulin, Total: 2.5 g/dL (ref 1.5–4.5)
Potassium: 4 mmol/L (ref 3.5–5.2)
Sodium: 141 mmol/L (ref 134–144)
TOTAL PROTEIN: 6.8 g/dL (ref 6.0–8.5)

## 2017-03-15 LAB — LIPID PANEL
CHOL/HDL RATIO: 4.2 ratio (ref 0.0–4.4)
Cholesterol, Total: 224 mg/dL — ABNORMAL HIGH (ref 100–199)
HDL: 53 mg/dL (ref >39)
LDL CALC: 145 mg/dL — AB (ref 0–99)
Triglycerides: 132 mg/dL (ref 0–149)
VLDL Cholesterol Cal: 26 mg/dL (ref 5–40)

## 2017-03-15 LAB — TSH: TSH: 0.864 u[IU]/mL (ref 0.450–4.500)

## 2017-05-20 ENCOUNTER — Other Ambulatory Visit: Payer: Self-pay | Admitting: *Deleted

## 2017-05-20 MED ORDER — METOPROLOL SUCCINATE ER 50 MG PO TB24: 50.0000 mg | ORAL_TABLET | Freq: Every day | ORAL | 2 refills | Status: DC

## 2017-05-20 NOTE — Telephone Encounter (Signed)
Faxed refill request for Metoprolol from [facility]. Refill sent to pharmacy electronically.

## 2017-08-09 ENCOUNTER — Ambulatory Visit: Payer: BLUE CROSS/BLUE SHIELD | Admitting: Family Medicine

## 2017-08-09 ENCOUNTER — Encounter: Payer: Self-pay | Admitting: Family Medicine

## 2017-08-09 VITALS — BP 128/82 | HR 78 | Temp 98.2°F | Ht 64.25 in | Wt 215.8 lb

## 2017-08-09 DIAGNOSIS — R05 Cough: Secondary | ICD-10-CM

## 2017-08-09 DIAGNOSIS — Z6835 Body mass index (BMI) 35.0-35.9, adult: Secondary | ICD-10-CM

## 2017-08-09 DIAGNOSIS — I10 Essential (primary) hypertension: Secondary | ICD-10-CM

## 2017-08-09 DIAGNOSIS — R058 Other specified cough: Secondary | ICD-10-CM

## 2017-08-09 DIAGNOSIS — R053 Chronic cough: Secondary | ICD-10-CM | POA: Insufficient documentation

## 2017-08-09 MED ORDER — BENZONATATE 200 MG PO CAPS: 200.0000 mg | ORAL_CAPSULE | Freq: Three times a day (TID) | ORAL | 1 refills | Status: DC | PRN

## 2017-08-09 MED ORDER — HYDROCHLOROTHIAZIDE 25 MG PO TABS: 25.0000 mg | ORAL_TABLET | Freq: Every day | ORAL | 3 refills | Status: DC

## 2017-08-09 MED ORDER — PROMETHAZINE-DM 6.25-15 MG/5ML PO SYRP: 5.0000 mL | ORAL_SOLUTION | Freq: Every evening | ORAL | 0 refills | Status: DC | PRN

## 2017-08-09 MED ORDER — METOPROLOL SUCCINATE ER 50 MG PO TB24: 50.0000 mg | ORAL_TABLET | Freq: Every day | ORAL | 3 refills | Status: DC

## 2017-08-09 NOTE — Assessment & Plan Note (Signed)
Discussed how this problem influences overall health and the risks it imposes  Reviewed plan for weight loss with lower calorie diet (via better food choices and also portion control or program like weight watchers) and exercise building up to or more than 30 minutes 5 days per week including some aerobic activity    

## 2017-08-09 NOTE — Assessment & Plan Note (Signed)
With reassuring exam  Trial of tessalon tid prometh-dm at bedtime with warning of sedation  Update if not starting to improve in a week or if worsening

## 2017-08-09 NOTE — Progress Notes (Signed)
Subjective:    Patient ID: Leslie Campbell, female    DOB: 02-Oct-1975, 42 y.o.   MRN: [id number]  HPI Here for HTN f/u   Wt Readings from Last 3 Encounters:  08/09/17 215 lb 12 oz (97.9 kg)  03/13/17 210 lb 4 oz (95.4 kg)  01/17/17 207 lb (93.9 kg)   36.75 kg/m   bp is stable today  Went to the dentist and it was really high 157/101   (took it twice with wrist cuff)   No cp or palpitations or headaches or edema  No side effects to medicines  BP Readings from Last 3 Encounters:  08/09/17 136/88  03/13/17 130/80  01/21/17 130/85  takes hctz and metoprolol   Is coughing a lot  Since the new year  Had uri around xmas  Better except for the cough  Non prod cough  When she gets hot or laughs -it gets worse  Took zarbys and robitussin    Lab Results  Component Value Date   CREATININE 1.06 (H) 03/13/2017   BUN 12 03/13/2017   NA 141 03/13/2017   K 4.0 03/13/2017   CL 99 03/13/2017   CO2 24 03/13/2017    Lab Results  Component Value Date   ALT 32 03/13/2017   AST 26 03/13/2017   ALKPHOS 74 03/13/2017   BILITOT 0.4 03/13/2017     Patient Active Problem List   Diagnosis Date Noted  . Post-viral cough syndrome 08/09/2017  . Routine general medical examination at a health care facility 03/13/2017  . Carpal tunnel syndrome 03/13/2017  . S/P cesarean section, indication: Active phase arrest  12/04/2016  . Postpartum care following cesarean delivery (6/5) 12/04/2016  . GDM, class A1 12/04/2016  . Chronic hypertension affecting pregnancy 12/04/2016  . IUGR (intrauterine growth restriction) affecting care of mother, third trimester, fetus 1 12/04/2016  . Allergic rhinitis 04/20/2015  . Encounter for health maintenance examination 04/18/2014  . Class 2 severe obesity due to excess calories with serious comorbidity and body mass index (BMI) of 35.0 to 35.9 in adult ([place]) 06/10/2009  . POLYCYSTIC OVARIAN DISEASE 03/15/2009  . BACK PAIN, LUMBAR, WITH RADICULOPATHY  09/09/2008  . Hyperlipidemia 08/03/2008  . HYPERTENSION, BENIGN ESSENTIAL 08/03/2008   Past Medical History:  Diagnosis Date  . HTN (hypertension)   . Hyperlipidemia    no meds  . Infertility associated with anovulation   . Infertility, female    clomid  . Migraine    otc med prn  . Miscarriage 01/2010  . Miscarriage 2012   x 5 - only 1 required surgery  . PCOS (polycystic ovarian syndrome)   . 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 CCY    . CERVICAL CERCLAGE N/A 06/22/2016   Procedure: Linna Caprice CERVICAL;  Surgeon: Brien Few, MD;  Location: [facility];  Service: Gynecology;  Laterality: N/A;  EDD: 12/25/16  . CESAREAN SECTION N/A 12/04/2016   Procedure: CESAREAN SECTION;  Surgeon: Brien Few, MD;  Location: [facility];  Service: Obstetrics;  Laterality: N/A;  . CHOLECYSTECTOMY    . DILATION AND CURETTAGE OF UTERUS     MAB  . LAPAROSCOPIC LYSIS OF ADHESIONS  01/21/2017   Procedure: LAPAROSCOPIC LYSIS OF ADHESIONS;  Surgeon: Brien Few, MD;  Location: [facility];  Service: Gynecology;;  . LAPAROSCOPIC OVARIAN CYSTECTOMY Bilateral   . LAPAROSCOPIC TUBAL LIGATION Bilateral 01/21/2017   Procedure: LAPAROSCOPIC TUBAL LIGATION;  Surgeon: Brien Few, MD;  Location:  [facility];  Service: Gynecology;  Laterality: Bilateral;  . miscarriage  01/2010  . WISDOM TOOTH EXTRACTION     Social History   Tobacco Use  . Smoking status: Never Smoker  . Smokeless tobacco: Never Used  Substance Use Topics  . Alcohol use: No    Alcohol/week: 0.0 oz  . Drug use: No   Family History  Problem Relation Age of Onset  . Hypertension Mother   . Hypertension Father   . Diabetes Brother   . Diabetes Other    No Known Allergies No current outpatient medications on file prior to visit.   No current facility-administered medications on file prior to visit.      Review of Systems  Constitutional: Negative for activity change,  appetite change, fatigue, fever and unexpected weight change.  HENT: Negative for congestion, ear pain, rhinorrhea, sinus pressure and sore throat.   Eyes: Negative for pain, redness and visual disturbance.  Respiratory: Positive for cough. Negative for shortness of breath, wheezing and stridor.   Cardiovascular: Negative for chest pain and palpitations.  Gastrointestinal: Negative for abdominal pain, blood in stool, constipation and diarrhea.  Endocrine: Negative for polydipsia and polyuria.  Genitourinary: Negative for dysuria, frequency and urgency.  Musculoskeletal: Negative for arthralgias, back pain and myalgias.  Skin: Negative for pallor and rash.  Allergic/Immunologic: Negative for environmental allergies.  Neurological: Negative for dizziness, syncope and headaches.  Hematological: Negative for adenopathy. Does not bruise/bleed easily.  Psychiatric/Behavioral: Negative for decreased concentration and dysphoric mood. The patient is not nervous/anxious.        Objective:   Physical Exam  Constitutional: She appears well-developed and well-nourished. No distress.  overwt and well appearing   HENT:  Head: Normocephalic and atraumatic.  Right Ear: External ear normal.  Left Ear: External ear normal.  Mouth/Throat: Oropharynx is clear and moist. No oropharyngeal exudate.  Nares are boggy Scant pnd   Eyes: Conjunctivae and EOM are normal. Pupils are equal, round, and reactive to light.  Neck: Normal range of motion. Neck supple. No JVD present. Carotid bruit is not present. No thyromegaly present.  Cardiovascular: Normal rate, regular rhythm, normal heart sounds and intact distal pulses. Exam reveals no gallop.  Pulmonary/Chest: Effort normal and breath sounds normal. No respiratory distress. She has no wheezes. She has no rales.  No crackles  Abdominal: Soft. Bowel sounds are normal. She exhibits no distension, no abdominal bruit and no mass. There is no tenderness.    Musculoskeletal: She exhibits no edema.  Lymphadenopathy:    She has no cervical adenopathy.  Neurological: She is alert. She has normal reflexes.  Skin: Skin is warm and dry. No rash noted.  Psychiatric: She has a normal mood and affect.          Assessment & Plan:   Problem List Items Addressed This Visit      Cardiovascular and Mediastinum   HYPERTENSION, BENIGN ESSENTIAL    bp in fair control at this time  BP Readings from Last 1 Encounters:  08/09/17 128/82   No changes needed Disc lifstyle change with low sodium diet and exercise  Much improved from dental visit        Relevant Medications   metoprolol succinate (TOPROL-XL) 50 MG 24 hr tablet   hydrochlorothiazide (HYDRODIURIL) 25 MG tablet     Respiratory   Post-viral cough syndrome - Primary    With reassuring exam  Trial of tessalon tid prometh-dm at bedtime with warning of sedation  Update if not starting  to improve in a week or if worsening          Other   Class 2 severe obesity due to excess calories with serious comorbidity and body mass index (BMI) of 35.0 to 35.9 in adult [facility])    Discussed how this problem influences overall health and the risks it imposes  Reviewed plan for weight loss with lower calorie diet (via better food choices and also portion control or program like weight watchers) and exercise building up to or more than 30 minutes 5 days per week including some aerobic activity

## 2017-08-09 NOTE — Assessment & Plan Note (Signed)
bp in fair control at this time  BP Readings from Last 1 Encounters:  08/09/17 128/82   No changes needed Disc lifstyle change with low sodium diet and exercise  Much improved from dental visit

## 2017-08-09 NOTE — Patient Instructions (Addendum)
Blood pressure is ok   For cyclic cough- try the tessalon three times daily and the premeth-DM at night  We need to break the cough cycle Update if not starting to improve in a week or if worsening     Follow up after 03/13/18 for your physical

## 2017-09-11 ENCOUNTER — Ambulatory Visit: Payer: 59 | Admitting: Family Medicine

## 2017-09-11 ENCOUNTER — Ambulatory Visit: Payer: BLUE CROSS/BLUE SHIELD | Admitting: Family Medicine

## 2018-08-15 ENCOUNTER — Encounter: Payer: Self-pay | Admitting: Family Medicine

## 2018-08-15 ENCOUNTER — Ambulatory Visit: Payer: BLUE CROSS/BLUE SHIELD | Admitting: Family Medicine

## 2018-08-15 ENCOUNTER — Other Ambulatory Visit: Payer: Self-pay | Admitting: Family Medicine

## 2018-08-15 VITALS — BP 126/82 | HR 82 | Temp 98.0°F | Ht 64.25 in | Wt 210.1 lb

## 2018-08-15 DIAGNOSIS — Z6835 Body mass index (BMI) 35.0-35.9, adult: Secondary | ICD-10-CM

## 2018-08-15 DIAGNOSIS — E7800 Pure hypercholesterolemia, unspecified: Secondary | ICD-10-CM

## 2018-08-15 DIAGNOSIS — I10 Essential (primary) hypertension: Secondary | ICD-10-CM

## 2018-08-15 DIAGNOSIS — R05 Cough: Secondary | ICD-10-CM

## 2018-08-15 DIAGNOSIS — E282 Polycystic ovarian syndrome: Secondary | ICD-10-CM | POA: Diagnosis not present

## 2018-08-15 DIAGNOSIS — R058 Other specified cough: Secondary | ICD-10-CM

## 2018-08-15 MED ORDER — HYDROCODONE-HOMATROPINE 5-1.5 MG/5ML PO SYRP: 5.0000 mL | ORAL_SOLUTION | Freq: Three times a day (TID) | ORAL | 0 refills | Status: DC | PRN

## 2018-08-15 MED ORDER — HYDROCHLOROTHIAZIDE 25 MG PO TABS: 25.0000 mg | ORAL_TABLET | Freq: Every day | ORAL | 3 refills | Status: DC

## 2018-08-15 MED ORDER — METOPROLOL SUCCINATE ER 50 MG PO TB24: 50.0000 mg | ORAL_TABLET | Freq: Every day | ORAL | 3 refills | Status: DC

## 2018-08-15 NOTE — Patient Instructions (Addendum)
Blood pressure is good   Try mucinex DM Max - for post viral cough  You can take the hycodan (caution of sedation) when not working or driving Update if not starting to improve in a week or if worsening    Drink lots of fluid Rest when you can

## 2018-08-15 NOTE — Progress Notes (Signed)
Subjective:    Patient ID: Leslie Campbell, female    DOB: 08-Apr-1976, 43 y.o.   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  Happier overall  Has 50 mo old daughter and 4 yo son-doing very well   theraflu Day quil  Nothing worked  Refilled Mining engineer -did not help either    Wt Readings from Last 3 Encounters:  08/15/18 210 lb 2 oz (95.3 kg)  08/09/17 215 lb 12 oz (97.9 kg)  03/13/17 210 lb 4 oz (95.4 kg)  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 Post-viral cough syndrome 08/09/2017  . Routine general medical examination at a health care facility 03/13/2017  . Carpal tunnel syndrome 03/13/2017  . S/P cesarean section, indication: Active phase arrest  12/04/2016  . Postpartum care following cesarean delivery (6/5) 12/04/2016  . GDM, class A1 12/04/2016  . Chronic hypertension affecting pregnancy 12/04/2016  . IUGR (intrauterine growth restriction) affecting care of mother, third trimester, fetus 1 12/04/2016  . Allergic rhinitis 04/20/2015  . Encounter for health maintenance examination 04/18/2014  . Class 2 severe obesity due to excess calories with serious comorbidity and  body mass index (BMI) of 35.0 to 35.9 in adult ([place]) 06/10/2009  . POLYCYSTIC OVARIAN DISEASE 03/15/2009  . BACK PAIN, LUMBAR, WITH RADICULOPATHY 09/09/2008  . Hyperlipidemia 08/03/2008  . HYPERTENSION, BENIGN ESSENTIAL 08/03/2008   Past Medical History:  Diagnosis Date  . HTN (hypertension)   . Hyperlipidemia    no meds  . Infertility associated with anovulation   . Infertility, female    clomid  . Migraine    otc med prn  . Miscarriage 01/2010  . Miscarriage 2012   x 5 - only 1 required surgery  . PCOS (polycystic ovarian syndrome)   . 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 CCY    . CERVICAL CERCLAGE N/A 06/22/2016   Procedure: Linna Caprice CERVICAL;  Surgeon: Brien Few, MD;  Location: [facility];  Service: Gynecology;  Laterality: N/A;  EDD: 12/25/16  . CESAREAN SECTION N/A 12/04/2016   Procedure: CESAREAN SECTION;  Surgeon: Brien Few, MD;  Location: [facility];  Service: Obstetrics;  Laterality: N/A;  . CHOLECYSTECTOMY    . DILATION AND CURETTAGE OF UTERUS     MAB  . LAPAROSCOPIC LYSIS OF ADHESIONS  01/21/2017   Procedure: LAPAROSCOPIC LYSIS OF ADHESIONS;  Surgeon: Brien Few, MD;  Location: [facility];  Service: Gynecology;;  .  LAPAROSCOPIC OVARIAN CYSTECTOMY Bilateral   . LAPAROSCOPIC TUBAL LIGATION Bilateral 01/21/2017   Procedure: LAPAROSCOPIC TUBAL LIGATION;  Surgeon: Brien Few, MD;  Location: [facility];  Service: Gynecology;  Laterality: Bilateral;  . miscarriage  01/2010  . WISDOM TOOTH EXTRACTION     Social History   Tobacco Use  . Smoking status: Never Smoker  . Smokeless tobacco: Never Used  Substance Use Topics  . Alcohol use: No    Alcohol/week: 0.0 standard drinks  . Drug use: No   Family History  Problem Relation Age of Onset  . Hypertension Mother   . Hypertension Father   . Diabetes Brother   . Diabetes Other    No Known Allergies No current outpatient medications on file  prior to visit.   No current facility-administered medications on file prior to visit.     Review of Systems  Constitutional: Negative for activity change, appetite change, fatigue, fever and unexpected weight change.  HENT: Negative for congestion, ear pain, rhinorrhea, sinus pressure and sore throat.   Eyes: Negative for pain, redness and visual disturbance.  Respiratory: Positive for cough. Negative for choking, chest tightness, shortness of breath and wheezing.   Cardiovascular: Negative for chest pain and palpitations.  Gastrointestinal: Negative for abdominal pain, blood in stool, constipation and diarrhea.  Endocrine: Negative for polydipsia and polyuria.  Genitourinary: Negative for dysuria, frequency and urgency.  Musculoskeletal: Negative for arthralgias, back pain and myalgias.  Skin: Negative for pallor and rash.  Allergic/Immunologic: Negative for environmental allergies.  Neurological: Negative for dizziness, syncope and headaches.  Hematological: Negative for adenopathy. Does not bruise/bleed easily.  Psychiatric/Behavioral: Negative for decreased concentration and dysphoric mood. The patient is not nervous/anxious.        Objective:   Physical Exam Constitutional:      General: She is not in acute distress.    Appearance: Normal appearance. She is well-developed. She is obese. She is not ill-appearing.  HENT:     Head: Normocephalic and atraumatic.     Right Ear: Tympanic membrane, ear canal and external ear normal.     Left Ear: Tympanic membrane, ear canal and external ear normal.     Ears:     Comments: Scant cerumen    Nose: Nose normal.     Mouth/Throat:     Mouth: Mucous membranes are moist.     Pharynx: Oropharynx is clear.  Eyes:     General: No scleral icterus.       Right eye: No discharge.        Left eye: No discharge.     Conjunctiva/sclera: Conjunctivae normal.     Pupils: Pupils are equal, round, and reactive to light.  Neck:      Musculoskeletal: Normal range of motion and neck supple.     Thyroid: No thyromegaly.     Vascular: No carotid bruit or JVD.  Cardiovascular:     Rate and Rhythm: Normal rate and regular rhythm.     Heart sounds: Normal heart sounds. No murmur. No gallop.   Pulmonary:     Effort: Pulmonary effort is normal. No respiratory distress.     Breath sounds: Normal breath sounds. No wheezing or rales.     Comments: Good air exch No rales/rhonchi or wheeze (even on forced exp) Abdominal:     General: Bowel sounds are normal. There is no distension.     Palpations: Abdomen is soft. There is no mass.     Tenderness: There is no abdominal tenderness.  Musculoskeletal:        General: No tenderness.  Lymphadenopathy:     Cervical: No cervical adenopathy.  Skin:    General: Skin is warm and dry.     Coloration: Skin is not pale.     Findings: No erythema or rash.  Neurological:     Mental Status: She is alert.     Cranial Nerves: No cranial nerve deficit.     Motor: No abnormal muscle tone.     Coordination: Coordination normal.     Deep Tendon Reflexes: Reflexes are normal and symmetric. Reflexes normal.  Psychiatric:        Mood and Affect: Mood normal.           Assessment & Plan:   Problem List Items Addressed This Visit      Cardiovascular and Mediastinum   HYPERTENSION, BENIGN ESSENTIAL - Primary    Good control with hctz and metoprolol xl bp in fair control at this time  BP Readings from Last 1 Encounters:  08/15/18 126/82  labs today No changes needed Most recent labs reviewed  Disc lifstyle change with low sodium diet and exercise         Relevant Medications   hydrochlorothiazide (HYDRODIURIL) 25 MG tablet   metoprolol succinate (TOPROL-XL) 50 MG 24 hr tablet   Other Relevant Orders   CBC with Differential/Platelet   Comprehensive metabolic panel   Lipid panel   TSH     Respiratory   Post-viral cough syndrome    S/p uri and flu this winter Other than  cough-feels fine  Worse cough at night Reassuring exam  Px hycodan for pm use with caution of sedation  Can try DM cough med (max dose) otc for day time Update if not starting to improve in a week or if worsening          Endocrine   POLYCYSTIC OVARIAN DISEASE    Sees gyn  Menses have been ok  No c/o        Other   Hyperlipidemia    Lab today  Per pt -healthy diet       Relevant Medications   hydrochlorothiazide (HYDRODIURIL) 25 MG tablet   metoprolol succinate (TOPROL-XL) 50 MG 24 hr tablet   Class 2 severe obesity due to excess calories with serious comorbidity and body mass index (BMI) of 35.0 to 35.9 in adult [facility])    Discussed how this problem influences overall health and the risks it imposes  Reviewed plan for weight loss with lower calorie diet (via better food choices and also portion control or program like weight watchers) and exercise building up to or more than 30 minutes 5 days per week including some aerobic activity   Commended on wt lost so far -has been more active when not sick

## 2018-08-15 NOTE — Assessment & Plan Note (Signed)
Discussed how this problem influences overall health and the risks it imposes  Reviewed plan for weight loss with lower calorie diet (via better food choices and also portion control or program like weight watchers) and exercise building up to or more than 30 minutes 5 days per week including some aerobic activity   Commended on wt lost so far -has been more active when not sick

## 2018-08-15 NOTE — Assessment & Plan Note (Addendum)
Good control with hctz and metoprolol xl bp in fair control at this time  BP Readings from Last 1 Encounters:  08/15/18 126/82  labs today No changes needed Most recent labs reviewed  Disc lifstyle change with low sodium diet and exercise

## 2018-08-15 NOTE — Assessment & Plan Note (Signed)
S/p uri and flu this winter Other than cough-feels fine  Worse cough at night Reassuring exam  Px hycodan for pm use with caution of sedation  Can try DM cough med (max dose) otc for day time Update if not starting to improve in a week or if worsening

## 2018-08-15 NOTE — Assessment & Plan Note (Signed)
Lab today  Per pt -healthy diet

## 2018-08-15 NOTE — Assessment & Plan Note (Signed)
Sees gyn  Menses have been ok  No c/o

## 2018-08-16 LAB — CBC WITH DIFFERENTIAL/PLATELET
Absolute Monocytes: 437 cells/uL (ref 200–950)
BASOS ABS: 31 {cells}/uL (ref 0–200)
Basophils Relative: 0.8 %
EOS PCT: 2.3 %
Eosinophils Absolute: 90 cells/uL (ref 15–500)
HCT: 39.8 % (ref 35.0–45.0)
Hemoglobin: 14.1 g/dL (ref 11.7–15.5)
Lymphs Abs: 1494 cells/uL (ref 850–3900)
MCH: 31.7 pg (ref 27.0–33.0)
MCHC: 35.4 g/dL (ref 32.0–36.0)
MCV: 89.4 fL (ref 80.0–100.0)
MPV: 12.3 fL (ref 7.5–12.5)
Monocytes Relative: 11.2 %
NEUTROS PCT: 47.4 %
Neutro Abs: 1849 cells/uL (ref 1500–7800)
Platelets: 285 10*3/uL (ref 140–400)
RBC: 4.45 10*6/uL (ref 3.80–5.10)
RDW: 12.8 % (ref 11.0–15.0)
Total Lymphocyte: 38.3 %
WBC: 3.9 10*3/uL (ref 3.8–10.8)

## 2018-08-16 LAB — COMPREHENSIVE METABOLIC PANEL
AG Ratio: 1.5 (calc) (ref 1.0–2.5)
ALKALINE PHOSPHATASE (APISO): 52 U/L (ref 31–125)
ALT: 22 U/L (ref 6–29)
AST: 19 U/L (ref 10–30)
Albumin: 4 g/dL (ref 3.6–5.1)
BILIRUBIN TOTAL: 0.9 mg/dL (ref 0.2–1.2)
BUN/Creatinine Ratio: 12 (calc) (ref 6–22)
BUN: 14 mg/dL (ref 7–25)
CALCIUM: 9 mg/dL (ref 8.6–10.2)
CO2: 29 mmol/L (ref 20–32)
Chloride: 102 mmol/L (ref 98–110)
Creat: 1.15 mg/dL — ABNORMAL HIGH (ref 0.50–1.10)
Globulin: 2.7 g/dL (calc) (ref 1.9–3.7)
Glucose, Bld: 78 mg/dL (ref 65–99)
POTASSIUM: 3.3 mmol/L — AB (ref 3.5–5.3)
Sodium: 140 mmol/L (ref 135–146)
Total Protein: 6.7 g/dL (ref 6.1–8.1)

## 2018-08-16 LAB — LIPID PANEL
CHOL/HDL RATIO: 4.6 (calc) (ref <5.0)
CHOLESTEROL: 231 mg/dL — AB (ref <200)
HDL: 50 mg/dL (ref >=50)
LDL CHOLESTEROL (CALC): 158 mg/dL — AB
Non-HDL Cholesterol (Calc): 181 mg/dL (calc) — ABNORMAL HIGH (ref <130)
Triglycerides: 109 mg/dL (ref <150)

## 2018-08-16 LAB — TSH: TSH: 0.54 mIU/L

## 2018-08-19 ENCOUNTER — Telehealth: Payer: Self-pay | Admitting: *Deleted

## 2018-08-19 NOTE — Telephone Encounter (Signed)
Left message with family requesting pt to call the office back regarding lab results

## 2018-08-20 MED ORDER — POTASSIUM CHLORIDE ER 10 MEQ PO TBCR: 10.0000 meq | EXTENDED_RELEASE_TABLET | Freq: Every day | ORAL | 5 refills | Status: DC

## 2018-08-20 NOTE — Telephone Encounter (Signed)
Pt returning your call from yesterday. Please call pt

## 2018-08-20 NOTE — Telephone Encounter (Signed)
Pt notified of lab results and Dr. Marliss Coots comments Rx sent to pharmacy and lab appts scheduled

## 2018-08-20 NOTE — Telephone Encounter (Signed)
-----   Message from Abner Greenspan, MD sent at 08/17/2018 12:15 PM EST ----- K is slightly low  Please send in klor con 10 meq 1 po QD #30 5 ref  Re check bmet in 2 weeks  Also inc water intake for kidney health   Cholesterol is up significantly  If diet is not good - Avoid red meat/ fried foods/ egg yolks/ fatty breakfast meats/ butter, cheese and high fat dairy/ and shellfish   Re check that in 2-3 mo please May need cholesterol medication

## 2018-09-08 ENCOUNTER — Other Ambulatory Visit (INDEPENDENT_AMBULATORY_CARE_PROVIDER_SITE_OTHER): Payer: BLUE CROSS/BLUE SHIELD

## 2018-09-08 DIAGNOSIS — I10 Essential (primary) hypertension: Secondary | ICD-10-CM | POA: Diagnosis not present

## 2018-09-08 LAB — POTASSIUM: Potassium: 3.4 mmol/L — ABNORMAL LOW (ref 3.5–5.3)

## 2018-09-08 NOTE — Addendum Note (Signed)
Addended by: Ellamae Sia on: 09/08/2018 10:00 AM   Modules accepted: Orders

## 2018-09-16 ENCOUNTER — Encounter: Payer: Self-pay | Admitting: *Deleted

## 2018-09-23 ENCOUNTER — Other Ambulatory Visit: Payer: Self-pay | Admitting: Family Medicine

## 2018-09-23 MED ORDER — HYDROCODONE-HOMATROPINE 5-1.5 MG/5ML PO SYRP: 5.0000 mL | ORAL_SOLUTION | Freq: Three times a day (TID) | ORAL | 0 refills | Status: DC | PRN

## 2018-09-23 NOTE — Telephone Encounter (Signed)
See prev note, pt received my letter requesting her to call back regarding her recent K level. Pt said she hasn't missed any does of K and has been taking it as prescribed.   Also wants refill of hycodan

## 2018-09-23 NOTE — Telephone Encounter (Signed)
Called pt and no answer and no VM set up

## 2018-09-23 NOTE — Telephone Encounter (Signed)
Pt notified of lab results and Dr. Marliss Coots comments regarding her potassium.  Pt said her cough is about the same as the last time she told Dr. Glori Bickers about it, she said the med is helping and she also has been using Mucinex too. Pt said it's a "dry hacky" cough and she really isn't coughing up any mucous

## 2018-09-23 NOTE — Telephone Encounter (Signed)
Pt need to speak to concerning her results that was sent to her.    Pt need refill on Hydrocodone syrup    [facility]

## 2018-09-23 NOTE — Telephone Encounter (Signed)
I sent the hycodan Please ask how much is she still coughing? Worse/improved or neither?  Continue K as ordered Thanks

## 2018-10-15 ENCOUNTER — Ambulatory Visit (INDEPENDENT_AMBULATORY_CARE_PROVIDER_SITE_OTHER): Payer: BLUE CROSS/BLUE SHIELD | Admitting: Family Medicine

## 2018-10-15 ENCOUNTER — Encounter: Payer: Self-pay | Admitting: Family Medicine

## 2018-10-15 VITALS — BP 144/87

## 2018-10-15 DIAGNOSIS — J301 Allergic rhinitis due to pollen: Secondary | ICD-10-CM | POA: Diagnosis not present

## 2018-10-15 DIAGNOSIS — R05 Cough: Secondary | ICD-10-CM

## 2018-10-15 DIAGNOSIS — R053 Chronic cough: Secondary | ICD-10-CM

## 2018-10-15 DIAGNOSIS — I10 Essential (primary) hypertension: Secondary | ICD-10-CM

## 2018-10-15 MED ORDER — ALBUTEROL SULFATE HFA 108 (90 BASE) MCG/ACT IN AERS: 2.0000 | INHALATION_SPRAY | RESPIRATORY_TRACT | 3 refills | Status: DC | PRN

## 2018-10-15 MED ORDER — FAMOTIDINE 20 MG PO TABS: 20.0000 mg | ORAL_TABLET | Freq: Two times a day (BID) | ORAL | 3 refills | Status: DC

## 2018-10-15 MED ORDER — FLUTICASONE PROPIONATE 50 MCG/ACT NA SUSP: 2.0000 | Freq: Every day | NASAL | 11 refills | Status: DC

## 2018-10-15 NOTE — Assessment & Plan Note (Signed)
Prev post viral cough-still going on (since dec)  Disc poss triggers including seasonal allergies, possible cough variant asthma and GERD  Will maximize allergy tx (clarinex and flonase)  Also add albuterol mdi with instructions for use  Also add pepcid 20 mg bid for possible reflux (with dietary mindfulness)  Urged pt to update if not improved in 1-2 weeks  At that point would consider a chest xray (she would have to come into the office) inst to call also if fever or sputum production occur

## 2018-10-15 NOTE — Assessment & Plan Note (Signed)
Per pt -bp has been in good control with current medications

## 2018-10-15 NOTE — Progress Notes (Signed)
Virtual Visit via Video Note  I connected with Leslie Campbell on 10/15/18 at  2:00 PM EDT by a video enabled telemedicine application and verified that I am speaking with the correct person using two identifiers.  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 The patient expressed understanding and agreed to proceed.  History of Present Illness: Still has the same cough since February A little deeper than it was  Not productive   At times she feels a little tight (not full wheeze)  Gets worse when she is hot  Some if talking  Feels like she is around smoke   No fever  No runny or stuffy nose  Just a cough   She clears her throat after coughing   Heartburn occ-not regularly   Given hycodan last time Also DM cough for max  Various otc  Lozenges help some   Yesterday- she coughed all day   Has seasonal allergies clarinex daily  flonase ns - occ   All the nasal d/c is clear  No headache and no sinus pain   Goes to sleep -sometimes wakes up coughing   bp was up a little shortly after medicine Has been fine overall   Review of Systems  Constitutional: Negative for chills, diaphoresis, fever, malaise/fatigue and weight loss.  HENT: Positive for congestion. Negative for ear discharge, ear pain, sinus pain and sore throat.   Eyes: Negative for discharge and redness.  Respiratory: Positive for cough. Negative for hemoptysis, sputum production, shortness of breath, wheezing and stridor.   Cardiovascular: Negative for chest pain and palpitations.  Gastrointestinal: Positive for heartburn. Negative for abdominal pain, nausea and vomiting.  Skin: Negative for itching and rash.  Neurological: Negative for dizziness and headaches.  Endo/Heme/Allergies: Positive for environmental allergies.        Patient Active Problem  List   Diagnosis Date Noted  . Chronic cough 08/09/2017  . Routine general medical examination at a health care facility 03/13/2017  . Carpal tunnel syndrome 03/13/2017  . S/P cesarean section, indication: Active phase arrest  12/04/2016  . Postpartum care following cesarean delivery (6/5) 12/04/2016  . GDM, class A1 12/04/2016  . Chronic hypertension affecting pregnancy 12/04/2016  . IUGR (intrauterine growth restriction) affecting care of mother, third trimester, fetus 1 12/04/2016  . Allergic rhinitis 04/20/2015  . Encounter for health maintenance examination 04/18/2014  . Class 2 severe obesity due to excess calories with serious comorbidity and body mass index (BMI) of 35.0 to 35.9 in adult ([place]) 06/10/2009  . POLYCYSTIC OVARIAN DISEASE 03/15/2009  . BACK PAIN, LUMBAR, WITH RADICULOPATHY 09/09/2008  . Hyperlipidemia 08/03/2008  . HYPERTENSION, BENIGN ESSENTIAL 08/03/2008   Past Medical History:  Diagnosis Date  . HTN (hypertension)   . Hyperlipidemia    no meds  . Infertility associated with anovulation   . Infertility, female    clomid  . Migraine    otc med prn  . Miscarriage 01/2010  . Miscarriage 2012   x 5 - only 1 required surgery  . PCOS (polycystic ovarian syndrome)   . 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 CCY    . CERVICAL CERCLAGE N/A 06/22/2016   Procedure: Linna Caprice CERVICAL;  Surgeon: Brien Few, MD;  Location: [facility];  Service: Gynecology;  Laterality:  N/A;  EDD: 12/25/16  . CESAREAN SECTION N/A 12/04/2016   Procedure: CESAREAN SECTION;  Surgeon: Brien Few, MD;  Location: [facility];  Service: Obstetrics;  Laterality: N/A;  . CHOLECYSTECTOMY    . DILATION AND CURETTAGE OF UTERUS     MAB  . LAPAROSCOPIC LYSIS OF ADHESIONS  01/21/2017   Procedure: LAPAROSCOPIC LYSIS OF ADHESIONS;  Surgeon: Brien Few, MD;  Location: [facility];  Service: Gynecology;;  . LAPAROSCOPIC OVARIAN CYSTECTOMY  Bilateral   . LAPAROSCOPIC TUBAL LIGATION Bilateral 01/21/2017   Procedure: LAPAROSCOPIC TUBAL LIGATION;  Surgeon: Brien Few, MD;  Location: [facility];  Service: Gynecology;  Laterality: Bilateral;  . miscarriage  01/2010  . WISDOM TOOTH EXTRACTION     Social History   Tobacco Use  . Smoking status: Never Smoker  . Smokeless tobacco: Never Used  Substance Use Topics  . Alcohol use: No    Alcohol/week: 0.0 standard drinks  . Drug use: No   Family History  Problem Relation Age of Onset  . Hypertension Mother   . Hypertension Father   . Diabetes Brother   . Diabetes Other    No Known Allergies Current Outpatient Medications on File Prior to Visit  Medication Sig Dispense Refill  . hydrochlorothiazide (HYDRODIURIL) 25 MG tablet Take 1 tablet (25 mg total) by mouth daily. 90 tablet 3  . metoprolol succinate (TOPROL-XL) 50 MG 24 hr tablet Take 1 tablet (50 mg total) by mouth daily. Take with or immediately following a meal. 90 tablet 3  . potassium chloride (KLOR-CON 10) 10 MEQ tablet Take 1 tablet (10 mEq total) by mouth daily. 30 tablet 5   No current facility-administered medications on file prior to visit.     Observations/Objective: Pt appears well /like her usual self but a little fatigued  No distress  Mood is good and she is mentally sharp /pleasant  Obese (baseline)  No facial swelling or rash or asymmetry  Is not sniffling or sneezing  Voice is not hoarse  Coughs a few times during interview- dry sounding  Not wheezing  Not sob with speech    Assessment and Plan: Problem List Items Addressed This Visit      Cardiovascular and Mediastinum   HYPERTENSION, BENIGN ESSENTIAL    Per pt -bp has been in good control with current medications         Respiratory   Allergic rhinitis    This may be adding to her chronic cough Disc pollen avoidance when able  Continue otc /generic clarinex  Also start back on flonase every day  No wheezing-but disc poss of a cough  variant asthma situation  Sent px for an albuterol mdi and will mail extra inst  Disc proper method of use  Will update         Other   Chronic cough - Primary    Prev post viral cough-still going on (since dec)  Disc poss triggers including seasonal allergies, possible cough variant asthma and GERD  Will maximize allergy tx (clarinex and flonase)  Also add albuterol mdi with instructions for use  Also add pepcid 20 mg bid for possible reflux (with dietary mindfulness)  Urged pt to update if not improved in 1-2 weeks  At that point would consider a chest xray (she would have to come into the office) inst to call also if fever or sputum production occur          Follow Up Instructions: For allergies-continue your daily antihistamine and add  flonase every day  Avoid pollen when able  Try the albuterol inhaler as needed 2 puffs up to every 4 hours for cough or tight chest Take pepcid 20 mg twice daily (if acid reflux is causing cough this will help)  If no improvement in 1-2 weeks , I want to get a chest xray so let us know    I discussed the assessment and treatment plan with the patient. The patient was provided an opportunity to ask questions and all were answered. The patient agreed with the plan and demonstrated an understanding of the instructions.   The patient was advised to call back or seek an in-person evaluation if the symptoms worsen or if the condition fails to improve as anticipated.    Loura Pardon, MD

## 2018-10-15 NOTE — Assessment & Plan Note (Signed)
This may be adding to her chronic cough Disc pollen avoidance when able  Continue otc /generic clarinex  Also start back on flonase every day  No wheezing-but disc poss of a cough variant asthma situation  Sent px for an albuterol mdi and will mail extra inst  Disc proper method of use  Will update

## 2018-11-17 ENCOUNTER — Telehealth: Payer: Self-pay | Admitting: Family Medicine

## 2018-11-17 ENCOUNTER — Other Ambulatory Visit: Payer: BLUE CROSS/BLUE SHIELD

## 2018-11-17 DIAGNOSIS — I10 Essential (primary) hypertension: Secondary | ICD-10-CM

## 2018-11-17 DIAGNOSIS — E7800 Pure hypercholesterolemia, unspecified: Secondary | ICD-10-CM

## 2018-11-17 NOTE — Telephone Encounter (Signed)
-----   Message from Cloyd Stagers, RT sent at 11/14/2018  1:16 PM EDT ----- Regarding: Lab Orders for Wednesday 5.20.2020 Please place lab orders for Wednesday 5.20.2020 Thank you, Dyke Maes RT(R)

## 2018-11-19 ENCOUNTER — Other Ambulatory Visit: Payer: BLUE CROSS/BLUE SHIELD

## 2018-11-19 DIAGNOSIS — E7800 Pure hypercholesterolemia, unspecified: Secondary | ICD-10-CM

## 2018-11-19 DIAGNOSIS — I10 Essential (primary) hypertension: Secondary | ICD-10-CM

## 2018-11-20 LAB — LIPID PANEL
Cholesterol: 218 mg/dL — ABNORMAL HIGH (ref <200)
HDL: 50 mg/dL (ref >=50)
LDL Cholesterol (Calc): 150 mg/dL (calc) — ABNORMAL HIGH
Non-HDL Cholesterol (Calc): 168 mg/dL (calc) — ABNORMAL HIGH (ref <130)
Total CHOL/HDL Ratio: 4.4 (calc) (ref <5.0)
Triglycerides: 81 mg/dL (ref <150)

## 2018-11-20 LAB — BASIC METABOLIC PANEL
BUN: 13 mg/dL (ref 7–25)
CO2: 31 mmol/L (ref 20–32)
Calcium: 9 mg/dL (ref 8.6–10.2)
Chloride: 103 mmol/L (ref 98–110)
Creat: 1.05 mg/dL (ref 0.50–1.10)
Glucose, Bld: 87 mg/dL (ref 65–99)
Potassium: 3.6 mmol/L (ref 3.5–5.3)
Sodium: 141 mmol/L (ref 135–146)

## 2018-11-24 ENCOUNTER — Telehealth: Payer: Self-pay | Admitting: Family Medicine

## 2018-11-24 MED ORDER — ATORVASTATIN CALCIUM 10 MG PO TABS: 10.0000 mg | ORAL_TABLET | Freq: Every day | ORAL | 11 refills | Status: DC

## 2018-11-24 NOTE — Telephone Encounter (Signed)
I sent atorvastatin 10 mg  Please re check fasting lipids with ast and alt in about 6 weeks Alert me if any side effects or problems

## 2018-11-25 ENCOUNTER — Telehealth: Payer: Self-pay | Admitting: Family Medicine

## 2018-11-25 NOTE — Telephone Encounter (Signed)
appt is scheduled

## 2018-11-25 NOTE — Telephone Encounter (Signed)
Patient hasn't set up her voice mail on her cell phone or answering machine. If patient returns my call,  patient needs to schedule a fasting lab in 6 weeks to check her cholesterol.

## 2019-01-06 ENCOUNTER — Other Ambulatory Visit: Payer: Self-pay | Admitting: Family Medicine

## 2019-01-06 ENCOUNTER — Telehealth: Payer: Self-pay | Admitting: Family Medicine

## 2019-01-06 DIAGNOSIS — E7800 Pure hypercholesterolemia, unspecified: Secondary | ICD-10-CM

## 2019-01-06 DIAGNOSIS — I10 Essential (primary) hypertension: Secondary | ICD-10-CM

## 2019-01-06 NOTE — Telephone Encounter (Signed)
-----   Message from Ellamae Sia sent at 01/05/2019  3:43 PM EDT ----- Regarding: Lab orders for Friday, 7.10.20 6 week lab orders

## 2019-01-09 ENCOUNTER — Other Ambulatory Visit (INDEPENDENT_AMBULATORY_CARE_PROVIDER_SITE_OTHER): Payer: BC Managed Care – PPO

## 2019-01-09 ENCOUNTER — Other Ambulatory Visit: Payer: Self-pay

## 2019-01-09 DIAGNOSIS — I10 Essential (primary) hypertension: Secondary | ICD-10-CM

## 2019-01-09 DIAGNOSIS — E7800 Pure hypercholesterolemia, unspecified: Secondary | ICD-10-CM | POA: Diagnosis not present

## 2019-01-10 LAB — ALT: ALT: 12 U/L (ref 6–29)

## 2019-01-10 LAB — LIPID PANEL
Cholesterol: 151 mg/dL (ref <200)
HDL: 48 mg/dL — ABNORMAL LOW (ref >=50)
LDL Cholesterol (Calc): 85 mg/dL (calc)
Non-HDL Cholesterol (Calc): 103 mg/dL (calc) (ref <130)
Total CHOL/HDL Ratio: 3.1 (calc) (ref <5.0)
Triglycerides: 87 mg/dL (ref <150)

## 2019-01-10 LAB — AST: AST: 14 U/L (ref 10–30)

## 2019-01-13 ENCOUNTER — Encounter: Payer: Self-pay | Admitting: *Deleted

## 2019-01-13 ENCOUNTER — Telehealth: Payer: Self-pay | Admitting: *Deleted

## 2019-01-13 NOTE — Telephone Encounter (Signed)
-----   Message from Abner Greenspan, MD sent at 01/11/2019 10:03 AM EDT ----- Cholesterol is much improved Continue the medication/atorvastatin

## 2019-02-18 ENCOUNTER — Other Ambulatory Visit: Payer: Self-pay

## 2019-02-18 DIAGNOSIS — Z20822 Contact with and (suspected) exposure to covid-19: Secondary | ICD-10-CM

## 2019-02-20 LAB — NOVEL CORONAVIRUS, NAA: SARS-CoV-2, NAA: NOT DETECTED

## 2019-03-05 ENCOUNTER — Other Ambulatory Visit: Payer: Self-pay | Admitting: Family Medicine

## 2019-03-31 ENCOUNTER — Other Ambulatory Visit: Payer: Self-pay | Admitting: Family Medicine

## 2019-04-24 LAB — HM PAP SMEAR

## 2019-08-24 ENCOUNTER — Telehealth: Payer: Self-pay | Admitting: Family Medicine

## 2019-08-24 DIAGNOSIS — E282 Polycystic ovarian syndrome: Secondary | ICD-10-CM

## 2019-08-24 DIAGNOSIS — I10 Essential (primary) hypertension: Secondary | ICD-10-CM

## 2019-08-24 DIAGNOSIS — E7800 Pure hypercholesterolemia, unspecified: Secondary | ICD-10-CM

## 2019-08-24 NOTE — Telephone Encounter (Signed)
-----   Message from Cloyd Stagers, RT sent at 08/21/2019 11:41 AM EST ----- Regarding: Lab Orders for Tuesday 2.23.2021 Please place lab orders for Tuesday 2.23.2021, office visit for physical on Tuesday 3.2.2021 Thank you, Dyke Maes RT(R)

## 2019-08-25 ENCOUNTER — Other Ambulatory Visit: Payer: Self-pay

## 2019-08-25 ENCOUNTER — Other Ambulatory Visit (INDEPENDENT_AMBULATORY_CARE_PROVIDER_SITE_OTHER): Payer: BC Managed Care – PPO

## 2019-08-25 DIAGNOSIS — I10 Essential (primary) hypertension: Secondary | ICD-10-CM

## 2019-08-25 DIAGNOSIS — E7800 Pure hypercholesterolemia, unspecified: Secondary | ICD-10-CM

## 2019-08-25 DIAGNOSIS — E282 Polycystic ovarian syndrome: Secondary | ICD-10-CM

## 2019-08-25 NOTE — Addendum Note (Signed)
Addended by: Cloyd Stagers on: 08/25/2019 08:17 AM   Modules accepted: Orders

## 2019-08-26 LAB — CBC WITH DIFFERENTIAL/PLATELET
Absolute Monocytes: 320 cells/uL (ref 200–950)
Basophils Absolute: 32 cells/uL (ref 0–200)
Basophils Relative: 0.8 %
Eosinophils Absolute: 120 cells/uL (ref 15–500)
Eosinophils Relative: 3 %
HCT: 42.4 % (ref 35.0–45.0)
Hemoglobin: 14.4 g/dL (ref 11.7–15.5)
Lymphs Abs: 1728 cells/uL (ref 850–3900)
MCH: 31.8 pg (ref 27.0–33.0)
MCHC: 34 g/dL (ref 32.0–36.0)
MCV: 93.6 fL (ref 80.0–100.0)
MPV: 12.2 fL (ref 7.5–12.5)
Monocytes Relative: 8 %
Neutro Abs: 1800 cells/uL (ref 1500–7800)
Neutrophils Relative %: 45 %
Platelets: 236 10*3/uL (ref 140–400)
RBC: 4.53 10*6/uL (ref 3.80–5.10)
RDW: 14.1 % (ref 11.0–15.0)
Total Lymphocyte: 43.2 %
WBC: 4 10*3/uL (ref 3.8–10.8)

## 2019-08-26 LAB — COMPREHENSIVE METABOLIC PANEL
AG Ratio: 1.4 (calc) (ref 1.0–2.5)
ALT: 17 U/L (ref 6–29)
AST: 18 U/L (ref 10–30)
Albumin: 3.8 g/dL (ref 3.6–5.1)
Alkaline phosphatase (APISO): 53 U/L (ref 31–125)
BUN: 16 mg/dL (ref 7–25)
CO2: 25 mmol/L (ref 20–32)
Calcium: 9.1 mg/dL (ref 8.6–10.2)
Chloride: 106 mmol/L (ref 98–110)
Creat: 1.1 mg/dL (ref 0.50–1.10)
Globulin: 2.7 g/dL (calc) (ref 1.9–3.7)
Glucose, Bld: 94 mg/dL (ref 65–99)
Potassium: 4.5 mmol/L (ref 3.5–5.3)
Sodium: 140 mmol/L (ref 135–146)
Total Bilirubin: 0.9 mg/dL (ref 0.2–1.2)
Total Protein: 6.5 g/dL (ref 6.1–8.1)

## 2019-08-26 LAB — LIPID PANEL
Cholesterol: 145 mg/dL (ref <200)
HDL: 57 mg/dL (ref >=50)
LDL Cholesterol (Calc): 74 mg/dL (calc)
Non-HDL Cholesterol (Calc): 88 mg/dL (calc) (ref <130)
Total CHOL/HDL Ratio: 2.5 (calc) (ref <5.0)
Triglycerides: 68 mg/dL (ref <150)

## 2019-08-26 LAB — HEMOGLOBIN A1C
Hgb A1c MFr Bld: 5.4 % of total Hgb (ref <5.7)
Mean Plasma Glucose: 108 (calc)
eAG (mmol/L): 6 (calc)

## 2019-08-26 LAB — TSH: TSH: 1.48 mIU/L

## 2019-09-01 ENCOUNTER — Other Ambulatory Visit: Payer: Self-pay

## 2019-09-01 ENCOUNTER — Ambulatory Visit (INDEPENDENT_AMBULATORY_CARE_PROVIDER_SITE_OTHER): Payer: BC Managed Care – PPO | Admitting: Family Medicine

## 2019-09-01 ENCOUNTER — Encounter: Payer: Self-pay | Admitting: Family Medicine

## 2019-09-01 VITALS — BP 130/88 | HR 64 | Temp 98.3°F | Ht 64.5 in | Wt 183.4 lb

## 2019-09-01 DIAGNOSIS — I10 Essential (primary) hypertension: Secondary | ICD-10-CM | POA: Diagnosis not present

## 2019-09-01 DIAGNOSIS — Z0000 Encounter for general adult medical examination without abnormal findings: Secondary | ICD-10-CM

## 2019-09-01 DIAGNOSIS — E669 Obesity, unspecified: Secondary | ICD-10-CM

## 2019-09-01 DIAGNOSIS — E282 Polycystic ovarian syndrome: Secondary | ICD-10-CM

## 2019-09-01 DIAGNOSIS — E7800 Pure hypercholesterolemia, unspecified: Secondary | ICD-10-CM | POA: Diagnosis not present

## 2019-09-01 MED ORDER — HYDROCHLOROTHIAZIDE 25 MG PO TABS: 25.0000 mg | ORAL_TABLET | Freq: Every day | ORAL | 3 refills | Status: DC

## 2019-09-01 MED ORDER — FAMOTIDINE 20 MG PO TABS: 20.0000 mg | ORAL_TABLET | Freq: Two times a day (BID) | ORAL | 3 refills | Status: DC

## 2019-09-01 MED ORDER — POTASSIUM CHLORIDE ER 10 MEQ PO TBCR: 10.0000 meq | EXTENDED_RELEASE_TABLET | Freq: Every day | ORAL | 3 refills | Status: DC

## 2019-09-01 MED ORDER — ALBUTEROL SULFATE HFA 108 (90 BASE) MCG/ACT IN AERS: 2.0000 | INHALATION_SPRAY | RESPIRATORY_TRACT | 3 refills | Status: DC | PRN

## 2019-09-01 MED ORDER — METOPROLOL SUCCINATE ER 50 MG PO TB24: 50.0000 mg | ORAL_TABLET | Freq: Every day | ORAL | 3 refills | Status: DC

## 2019-09-01 MED ORDER — FLUTICASONE PROPIONATE 50 MCG/ACT NA SUSP: 2.0000 | Freq: Every day | NASAL | 3 refills | Status: DC

## 2019-09-01 MED ORDER — ATORVASTATIN CALCIUM 10 MG PO TABS: 10.0000 mg | ORAL_TABLET | Freq: Every day | ORAL | 3 refills | Status: DC

## 2019-09-01 NOTE — Assessment & Plan Note (Signed)
Disc goals for lipids and reasons to control them Rev last labs with pt Rev low sat fat diet in detail Good control with atorvastatin and diet  HDL is up and LDL is down

## 2019-09-01 NOTE — Assessment & Plan Note (Signed)
Discussed how this problem influences overall health and the risks it imposes  Reviewed plan for weight loss with lower calorie diet (via better food choices and also portion control or program like weight watchers) and exercise building up to or more than 30 minutes 5 days per week including some aerobic activity   Commended on wt loss so far

## 2019-09-01 NOTE — Assessment & Plan Note (Addendum)
Lab Results  Component Value Date   HGBA1C 5.4 08/25/2019   Enc further wt loss and healthy habits  Continues gyn care

## 2019-09-01 NOTE — Progress Notes (Signed)
Subjective:    Patient ID: Leslie Campbell, female    DOB: 1976/05/03, 44 y.o.   MRN: [id number]  This visit occurred during the SARS-CoV-2 public health emergency.  Safety protocols were in place, including screening questions prior to the visit, additional usage of staff PPE, and extensive cleaning of exam room while observing appropriate contact time as indicated for disinfecting solutions.    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  Lots of exercise-delivering  31.00 kg/m   She gained 5 lb back and lost 2 lb  She feels good   Mammogram 04/24/19  No new lumps on self exam   She drinks protein drinks in the am  She eats a meal when she gets home  Not junk food   Pt sees gyn for breast/pelvic care  Had a visit in October -did a pap /it was normal  Has PCOS   Flu shot - she did not get one this year   Tdap 4/18   bp is stable today  No cp or palpitations or headaches or edema  No side effects to medicines  BP Readings from Last 3 Encounters:  09/01/19 (!) 130/92  10/15/18 (!) 144/87  08/15/18 126/82     Has not checked bp lately  Usually in this ballpark   Taking hctz 25= has to take in pm since she drives during the day  Metoprolol xl 50 mg   Pulse Readings from Last 3 Encounters:  09/01/19 64  08/15/18 82  08/09/17 78    Hx of gestational DM Glucose is 94  Lab Results  Component Value Date   HGBA1C 5.4 08/25/2019   Very good   Lab Results  Component Value Date   CREATININE 1.10 08/25/2019   BUN 16 08/25/2019   NA 140 08/25/2019   K 4.5 08/25/2019   CL 106 08/25/2019   CO2 25 08/25/2019    Hyperlipidemia Lab Results  Component Value Date     CHOL 145 08/25/2019   CHOL 151 01/09/2019   CHOL 218 (H) 11/19/2018   Lab Results  Component Value Date   HDL 57 08/25/2019   HDL 48 (L) 01/09/2019   HDL 50 11/19/2018   Lab Results  Component Value Date   LDLCALC 74 08/25/2019   LDLCALC 85 01/09/2019   LDLCALC 150 (H) 11/19/2018   Lab Results  Component Value Date   TRIG 68 08/25/2019   TRIG 87 01/09/2019   TRIG 81 11/19/2018   Lab Results  Component Value Date   CHOLHDL 2.5 08/25/2019   CHOLHDL 3.1 01/09/2019   CHOLHDL 4.4 11/19/2018   Lab Results  Component Value Date   LDLDIRECT 169.5 08/03/2008  taking atorvastatin  HDL up and LDL is down   Lab Results  Component Value Date   TSH 1.48 08/25/2019    Lab Results  Component Value Date   WBC 4.0 08/25/2019   HGB 14.4 08/25/2019   HCT 42.4 08/25/2019   MCV 93.6 08/25/2019   PLT 236 08/25/2019   Patient Active Problem List   Diagnosis Date Noted  .  Chronic cough 08/09/2017  . Routine general medical examination at a health care facility 03/13/2017  . Carpal tunnel syndrome 03/13/2017  . S/P cesarean section, indication: Active phase arrest  12/04/2016  . Postpartum care following cesarean delivery (6/5) 12/04/2016  . GDM, class A1 12/04/2016  . Chronic hypertension affecting pregnancy 12/04/2016  . IUGR (intrauterine growth restriction) affecting care of mother, third trimester, fetus 1 12/04/2016  . Allergic rhinitis 04/20/2015  . Encounter for health maintenance examination 04/18/2014  . Obesity (BMI 30-39.9) 06/10/2009  . POLYCYSTIC OVARIAN DISEASE 03/15/2009  . BACK PAIN, LUMBAR, WITH RADICULOPATHY 09/09/2008  . Hyperlipidemia 08/03/2008  . HYPERTENSION, BENIGN ESSENTIAL 08/03/2008   Past Medical History:  Diagnosis Date  . HTN (hypertension)   . Hyperlipidemia    no meds  . Infertility associated with anovulation   . Infertility, female    clomid  . Migraine    otc med prn  . Miscarriage 01/2010  . Miscarriage 2012   x 5 - only 1  required surgery  . PCOS (polycystic ovarian syndrome)   . 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 CCY    . CERVICAL CERCLAGE N/A 06/22/2016   Procedure: Linna Caprice CERVICAL;  Surgeon: Brien Few, MD;  Location: [facility];  Service: Gynecology;  Laterality: N/A;  EDD: 12/25/16  . CESAREAN SECTION N/A 12/04/2016   Procedure: CESAREAN SECTION;  Surgeon: Brien Few, MD;  Location: [facility];  Service: Obstetrics;  Laterality: N/A;  . CHOLECYSTECTOMY    . DILATION AND CURETTAGE OF UTERUS     MAB  . LAPAROSCOPIC LYSIS OF ADHESIONS  01/21/2017   Procedure: LAPAROSCOPIC LYSIS OF ADHESIONS;  Surgeon: Brien Few, MD;  Location: [facility];  Service: Gynecology;;  . LAPAROSCOPIC OVARIAN CYSTECTOMY Bilateral   . LAPAROSCOPIC TUBAL LIGATION Bilateral 01/21/2017   Procedure: LAPAROSCOPIC TUBAL LIGATION;  Surgeon: Brien Few, MD;  Location: [facility];  Service: Gynecology;  Laterality: Bilateral;  . miscarriage  01/2010  . WISDOM TOOTH EXTRACTION     Social History   Tobacco Use  . Smoking status: Never Smoker  . Smokeless tobacco: Never Used  Substance Use Topics  . Alcohol use: No    Alcohol/week: 0.0 standard drinks  . Drug use: No   Family History  Problem Relation Age of Onset  . Hypertension Mother   . Hypertension Father   . Diabetes Brother   . Diabetes Other    No Known Allergies No current outpatient medications on file prior to visit.   No current facility-administered medications on file prior to visit.     Review of Systems  Constitutional: Negative for activity change, appetite change, fatigue, fever and unexpected weight change.  HENT: Negative for congestion, ear pain, rhinorrhea, sinus pressure and sore throat.   Eyes: Negative for pain, redness and visual disturbance.  Respiratory: Negative for cough, shortness of breath and wheezing.   Cardiovascular: Negative for chest pain and palpitations.    Gastrointestinal: Negative for abdominal pain, blood in stool, constipation and diarrhea.  Endocrine: Negative for polydipsia and polyuria.  Genitourinary: Negative for dysuria, frequency and urgency.  Musculoskeletal: Negative for arthralgias, back pain and myalgias.  Skin: Negative for pallor and rash.  Allergic/Immunologic: Negative for environmental allergies.  Neurological: Negative for dizziness, syncope and headaches.  Hematological: Negative for adenopathy. Does not bruise/bleed easily.  Psychiatric/Behavioral: Negative for decreased concentration and dysphoric mood. The patient is not nervous/anxious.        Objective:  Physical Exam Constitutional:      General: She is not in acute distress.    Appearance: Normal appearance. She is well-developed. She is obese. She is not ill-appearing or diaphoretic.  HENT:     Head: Normocephalic and atraumatic.     Right Ear: Tympanic membrane, ear canal and external ear normal.     Left Ear: Tympanic membrane, ear canal and external ear normal.     Nose: Nose normal. No congestion.     Mouth/Throat:     Mouth: Mucous membranes are moist.     Pharynx: Oropharynx is clear. No posterior oropharyngeal erythema.  Eyes:     General: No scleral icterus.    Extraocular Movements: Extraocular movements intact.     Conjunctiva/sclera: Conjunctivae normal.     Pupils: Pupils are equal, round, and reactive to light.  Neck:     Thyroid: No thyromegaly.     Vascular: No carotid bruit or JVD.  Cardiovascular:     Rate and Rhythm: Normal rate and regular rhythm.     Pulses: Normal pulses.     Heart sounds: Normal heart sounds. No gallop.   Pulmonary:     Effort: Pulmonary effort is normal. No respiratory distress.     Breath sounds: Normal breath sounds. No wheezing.     Comments: Good air exch Chest:     Chest wall: No tenderness.  Abdominal:     General: Bowel sounds are normal. There is no distension or abdominal bruit.      Palpations: Abdomen is soft. There is no mass.     Tenderness: There is no abdominal tenderness.     Hernia: No hernia is present.  Genitourinary:    Comments: Breast and pelvic exam done by gyn Musculoskeletal:        General: No tenderness. Normal range of motion.     Cervical back: Normal range of motion and neck supple. No rigidity. No muscular tenderness.     Right lower leg: No edema.     Left lower leg: No edema.  Lymphadenopathy:     Cervical: No cervical adenopathy.  Skin:    General: Skin is warm and dry.     Coloration: Skin is not pale.     Findings: No erythema or rash.     Comments: Some lentigines   Neurological:     Mental Status: She is alert. Mental status is at baseline.     Cranial Nerves: No cranial nerve deficit.     Motor: No abnormal muscle tone.     Coordination: Coordination normal.     Gait: Gait normal.     Deep Tendon Reflexes: Reflexes are normal and symmetric. Reflexes normal.  Psychiatric:        Mood and Affect: Mood normal.        Cognition and Memory: Cognition and memory normal.           Assessment & Plan:   Problem List Items Addressed This Visit      Cardiovascular and Mediastinum   HYPERTENSION, BENIGN ESSENTIAL    Fair control with current medications bp in fair control at this time  BP Readings from Last 1 Encounters:  09/01/19 130/88  strong family hx of HTN No changes needed Most recent labs reviewed  Disc lifstyle change with low sodium diet and exercise   Commended wt loss and better habits       Relevant Medications   atorvastatin (LIPITOR) 10 MG tablet   hydrochlorothiazide (HYDRODIURIL) 25  MG tablet   metoprolol succinate (TOPROL-XL) 50 MG 24 hr tablet     Endocrine   POLYCYSTIC OVARIAN DISEASE    Lab Results  Component Value Date   HGBA1C 5.4 08/25/2019   Enc further wt loss and healthy habits  Continues gyn care         Other   Hyperlipidemia    Disc goals for lipids and reasons to control  them Rev last labs with pt Rev low sat fat diet in detail Good control with atorvastatin and diet  HDL is up and LDL is down        Relevant Medications   atorvastatin (LIPITOR) 10 MG tablet   hydrochlorothiazide (HYDRODIURIL) 25 MG tablet   metoprolol succinate (TOPROL-XL) 50 MG 24 hr tablet   Obesity (BMI 30-39.9)    Discussed how this problem influences overall health and the risks it imposes  Reviewed plan for weight loss with lower calorie diet (via better food choices and also portion control or program like weight watchers) and exercise building up to or more than 30 minutes 5 days per week including some aerobic activity   Commended on wt loss so far       Routine general medical examination at a health care facility - Primary    Reviewed health habits including diet and exercise and skin cancer prevention Reviewed appropriate screening tests for age  Also reviewed health mt list, fam hx and immunization status , as well as social and family history   See HPI Labs reviewed  Commended wt loss and good habits  utd with gyn care/enc self breast exams

## 2019-09-01 NOTE — Patient Instructions (Signed)
Keep up the good work with weight loss Stay active   Try to eat a healthy diet  Watch BP if you can   Labs look good/stable

## 2019-09-01 NOTE — Assessment & Plan Note (Signed)
Fair control with current medications bp in fair control at this time  BP Readings from Last 1 Encounters:  09/01/19 130/88  strong family hx of HTN No changes needed Most recent labs reviewed  Disc lifstyle change with low sodium diet and exercise   Commended wt loss and better habits

## 2019-09-01 NOTE — Assessment & Plan Note (Signed)
Reviewed health habits including diet and exercise and skin cancer prevention Reviewed appropriate screening tests for age  Also reviewed health mt list, fam hx and immunization status , as well as social and family history   See HPI Labs reviewed  Commended wt loss and good habits  utd with gyn care/enc self breast exams

## 2020-04-14 ENCOUNTER — Other Ambulatory Visit: Payer: Self-pay | Admitting: Family Medicine

## 2020-08-28 ENCOUNTER — Other Ambulatory Visit: Payer: Self-pay | Admitting: Family Medicine

## 2020-08-30 ENCOUNTER — Other Ambulatory Visit: Payer: Self-pay | Admitting: Family Medicine

## 2020-09-06 ENCOUNTER — Other Ambulatory Visit: Payer: Self-pay | Admitting: Family Medicine

## 2020-09-13 ENCOUNTER — Telehealth: Payer: Self-pay | Admitting: Family Medicine

## 2020-09-13 DIAGNOSIS — I10 Essential (primary) hypertension: Secondary | ICD-10-CM

## 2020-09-13 DIAGNOSIS — E7800 Pure hypercholesterolemia, unspecified: Secondary | ICD-10-CM

## 2020-09-13 DIAGNOSIS — E282 Polycystic ovarian syndrome: Secondary | ICD-10-CM

## 2020-09-13 NOTE — Telephone Encounter (Signed)
-----   Message from Cloyd Stagers, RT sent at 08/30/2020  9:23 AM EST ----- Regarding: Lab Orders for Wednesday 3.16.2022 Please place lab orders for Wednesday 3.16.2022, office visit for physical on Wednesday 3.23.2022 Thank you, Dyke Maes RT(R)

## 2020-09-14 ENCOUNTER — Other Ambulatory Visit: Payer: Self-pay

## 2020-09-14 ENCOUNTER — Other Ambulatory Visit (INDEPENDENT_AMBULATORY_CARE_PROVIDER_SITE_OTHER): Payer: BC Managed Care – PPO

## 2020-09-14 DIAGNOSIS — E282 Polycystic ovarian syndrome: Secondary | ICD-10-CM

## 2020-09-14 DIAGNOSIS — I10 Essential (primary) hypertension: Secondary | ICD-10-CM

## 2020-09-14 DIAGNOSIS — E7800 Pure hypercholesterolemia, unspecified: Secondary | ICD-10-CM

## 2020-09-14 NOTE — Addendum Note (Signed)
Addended by: Cloyd Stagers on: 09/14/2020 08:31 AM   Modules accepted: Orders

## 2020-09-15 LAB — LIPID PANEL
Cholesterol: 137 mg/dL (ref <200)
HDL: 62 mg/dL (ref >=50)
LDL Cholesterol (Calc): 62 mg/dL (calc)
Non-HDL Cholesterol (Calc): 75 mg/dL (calc) (ref <130)
Total CHOL/HDL Ratio: 2.2 (calc) (ref <5.0)
Triglycerides: 51 mg/dL (ref <150)

## 2020-09-15 LAB — HEMOGLOBIN A1C
Hgb A1c MFr Bld: 5.1 % of total Hgb (ref <5.7)
Mean Plasma Glucose: 100 mg/dL
eAG (mmol/L): 5.5 mmol/L

## 2020-09-15 LAB — CBC WITH DIFFERENTIAL/PLATELET
Absolute Monocytes: 322 cells/uL (ref 200–950)
Basophils Absolute: 31 cells/uL (ref 0–200)
Basophils Relative: 1 %
Eosinophils Absolute: 102 cells/uL (ref 15–500)
Eosinophils Relative: 3.3 %
HCT: 42.4 % (ref 35.0–45.0)
Hemoglobin: 14.2 g/dL (ref 11.7–15.5)
Lymphs Abs: 1243 cells/uL (ref 850–3900)
MCH: 32.1 pg (ref 27.0–33.0)
MCHC: 33.5 g/dL (ref 32.0–36.0)
MCV: 95.7 fL (ref 80.0–100.0)
MPV: 11.9 fL (ref 7.5–12.5)
Monocytes Relative: 10.4 %
Neutro Abs: 1401 cells/uL — ABNORMAL LOW (ref 1500–7800)
Neutrophils Relative %: 45.2 %
Platelets: 235 10*3/uL (ref 140–400)
RBC: 4.43 10*6/uL (ref 3.80–5.10)
RDW: 12.4 % (ref 11.0–15.0)
Total Lymphocyte: 40.1 %
WBC: 3.1 10*3/uL — ABNORMAL LOW (ref 3.8–10.8)

## 2020-09-15 LAB — COMPREHENSIVE METABOLIC PANEL
AG Ratio: 1.6 (calc) (ref 1.0–2.5)
ALT: 20 U/L (ref 6–29)
AST: 20 U/L (ref 10–30)
Albumin: 3.8 g/dL (ref 3.6–5.1)
Alkaline phosphatase (APISO): 47 U/L (ref 31–125)
BUN: 15 mg/dL (ref 7–25)
CO2: 29 mmol/L (ref 20–32)
Calcium: 9.2 mg/dL (ref 8.6–10.2)
Chloride: 105 mmol/L (ref 98–110)
Creat: 0.87 mg/dL (ref 0.50–1.10)
Globulin: 2.4 g/dL (calc) (ref 1.9–3.7)
Glucose, Bld: 80 mg/dL (ref 65–99)
Potassium: 4.4 mmol/L (ref 3.5–5.3)
Sodium: 139 mmol/L (ref 135–146)
Total Bilirubin: 1 mg/dL (ref 0.2–1.2)
Total Protein: 6.2 g/dL (ref 6.1–8.1)

## 2020-09-15 LAB — TSH: TSH: 1.04 mIU/L

## 2020-09-21 ENCOUNTER — Encounter: Payer: BC Managed Care – PPO | Admitting: Family Medicine

## 2020-09-28 ENCOUNTER — Ambulatory Visit (INDEPENDENT_AMBULATORY_CARE_PROVIDER_SITE_OTHER): Payer: BC Managed Care – PPO

## 2020-09-28 ENCOUNTER — Other Ambulatory Visit: Payer: Self-pay

## 2020-09-28 ENCOUNTER — Encounter (HOSPITAL_COMMUNITY): Payer: Self-pay

## 2020-09-28 ENCOUNTER — Ambulatory Visit (HOSPITAL_COMMUNITY)
Admission: EM | Admit: 2020-09-28 | Discharge: 2020-09-28 | Disposition: A | Discharge status: Home / Self Care | Payer: BC Managed Care – PPO | Attending: Family Medicine | Admitting: Family Medicine

## 2020-09-28 DIAGNOSIS — R030 Elevated blood-pressure reading, without diagnosis of hypertension: Secondary | ICD-10-CM

## 2020-09-28 DIAGNOSIS — M25571 Pain in right ankle and joints of right foot: Secondary | ICD-10-CM

## 2020-09-28 DIAGNOSIS — S93401A Sprain of unspecified ligament of right ankle, initial encounter: Secondary | ICD-10-CM | POA: Diagnosis not present

## 2020-09-28 DIAGNOSIS — W19XXXA Unspecified fall, initial encounter: Secondary | ICD-10-CM

## 2020-09-28 NOTE — ED Provider Notes (Signed)
[facility]    CSN: [id number] Arrival date & time: 09/28/20  0845      History   Chief Complaint Chief Complaint  Patient presents with  . Ankle Pain    HPI Leslie Campbell is a 45 y.o. female.   Patient here today with 1 day history of right bilateral ankle pain after stepping off of a concrete paver yesterday at home.  She states she thinks she turned her ankle when it happened and the majority of the swelling is on the lateral aspect but she is also having pain in the medial aspect as well.  Denies bruising, swelling into the foot, numbness, tingling, inability to bear weight.  So far she has been trying ice, elevation, compression, over-the-counter pain relievers with minimal relief.  No known history of previous ankle injuries.     Past Medical History:  Diagnosis Date  . HTN (hypertension)   . Hyperlipidemia    no meds  . Infertility associated with anovulation   . Infertility, female    clomid  . Migraine    otc med prn  . Miscarriage 01/2010  . Miscarriage 2012   x 5 - only 1 required surgery  . PCOS (polycystic ovarian syndrome)   . Pituitary hyperfunction [facility])     Patient Active Problem List   Diagnosis Date Noted  . Chronic cough 08/09/2017  . Routine general medical examination at a health care facility 03/13/2017  . Carpal tunnel syndrome 03/13/2017  . S/P cesarean section, indication: Active phase arrest  12/04/2016  . Postpartum care following cesarean delivery (6/5) 12/04/2016  . GDM, class A1 12/04/2016  . Chronic hypertension affecting pregnancy 12/04/2016  . IUGR (intrauterine growth restriction) affecting care of mother, third trimester, fetus 1 12/04/2016  . Allergic rhinitis 04/20/2015  . Encounter for health maintenance examination 04/18/2014  . Obesity (BMI 30-39.9) 06/10/2009  . POLYCYSTIC OVARIAN DISEASE 03/15/2009  . BACK PAIN, LUMBAR, WITH RADICULOPATHY 09/09/2008  . Hyperlipidemia 08/03/2008  . HYPERTENSION, BENIGN  ESSENTIAL 08/03/2008    Past Surgical History:  Procedure Laterality Date  . CCY    . CERVICAL CERCLAGE N/A 06/22/2016   Procedure: Linna Caprice CERVICAL;  Surgeon: Brien Few, MD;  Location: [facility];  Service: Gynecology;  Laterality: N/A;  EDD: 12/25/16  . CESAREAN SECTION N/A 12/04/2016   Procedure: CESAREAN SECTION;  Surgeon: Brien Few, MD;  Location: [facility];  Service: Obstetrics;  Laterality: N/A;  . CHOLECYSTECTOMY    . DILATION AND CURETTAGE OF UTERUS     MAB  . LAPAROSCOPIC LYSIS OF ADHESIONS  01/21/2017   Procedure: LAPAROSCOPIC LYSIS OF ADHESIONS;  Surgeon: Brien Few, MD;  Location: [facility];  Service: Gynecology;;  . LAPAROSCOPIC OVARIAN CYSTECTOMY Bilateral   . LAPAROSCOPIC TUBAL LIGATION Bilateral 01/21/2017   Procedure: LAPAROSCOPIC TUBAL LIGATION;  Surgeon: Brien Few, MD;  Location: [facility];  Service: Gynecology;  Laterality: Bilateral;  . miscarriage  01/2010  . WISDOM TOOTH EXTRACTION      OB History    Gravida  5   Para  1   Term  1   Preterm      AB  4   Living  1     SAB  4   IAB      Ectopic      Multiple  0   Live Births  1            Home Medications    Prior to Admission medications   Medication  Sig Start Date End Date Taking? Authorizing Provider  albuterol (VENTOLIN HFA) 108 (90 Base) MCG/ACT inhaler INHALE 2 PUFFS INTO THE LUNGS EVERY 4 (FOUR) HOURS AS NEEDED (COUGH, TIGHT CHEST OR WHEEZING). 04/18/20   Tower, Wynelle Fanny, MD  atorvastatin (LIPITOR) 10 MG tablet TAKE 1 TABLET BY MOUTH EVERY DAY 09/06/20   Tower, Wynelle Fanny, MD  famotidine (PEPCID) 20 MG tablet TAKE 1 TABLET BY MOUTH TWICE A DAY 08/30/20   Tower, Wynelle Fanny, MD  fluticasone (FLONASE) 50 MCG/ACT nasal spray SPRAY 2 SPRAYS INTO EACH NOSTRIL EVERY DAY 08/30/20   Tower, Wynelle Fanny, MD  hydrochlorothiazide (HYDRODIURIL) 25 MG tablet TAKE 1 TABLET BY MOUTH EVERY DAY 08/30/20   Tower, Wynelle Fanny, MD  metoprolol succinate (TOPROL-XL) 50 MG 24 hr tablet TAKE 1 TABLET  (50 MG TOTAL) BY MOUTH DAILY. TAKE WITH OR IMMEDIATELY FOLLOWING A MEAL. 08/30/20   Tower, Wynelle Fanny, MD  potassium chloride (KLOR-CON) 10 MEQ tablet TAKE 1 TABLET BY MOUTH EVERY DAY 08/30/20   Tower, Wynelle Fanny, MD    Family History Family History  Problem Relation Age of Onset  . Hypertension Mother   . Hypertension Father   . Diabetes Brother   . Diabetes Other     Social History Social History   Tobacco Use  . Smoking status: Never Smoker  . Smokeless tobacco: Never Used  Vaping Use  . Vaping Use: Never used  Substance Use Topics  . Alcohol use: No    Alcohol/week: 0.0 standard drinks  . Drug use: No     Allergies   Patient has no known allergies.   Review of Systems Review of Systems Per HPI  Physical Exam Triage Vital Signs ED Triage Vitals  Enc Vitals Group     BP 09/28/20 0900 (!) 186/116     Pulse Rate 09/28/20 0900 70     Resp 09/28/20 0900 18     Temp 09/28/20 0900 98.7 F (37.1 C)     Temp src --      SpO2 09/28/20 0900 100 %     Weight --      Height --      Head Circumference --      Peak Flow --      Pain Score 09/28/20 0859 7     Pain Loc --      Pain Edu? --      Excl. in [place]? --    No data found.  Updated Vital Signs BP (!) 188/102 (BP Location: Left Arm)   Pulse 70   Temp 98.7 F (37.1 C)   Resp 18   LMP 09/27/2020 (Approximate)   SpO2 100%   Visual Acuity Right Eye Distance:   Left Eye Distance:   Bilateral Distance:    Right Eye Near:   Left Eye Near:    Bilateral Near:     Physical Exam Vitals and nursing note reviewed.  Constitutional:      Appearance: Normal appearance. She is not ill-appearing.  HENT:     Head: Atraumatic.  Eyes:     Extraocular Movements: Extraocular movements intact.     Conjunctiva/sclera: Conjunctivae normal.  Cardiovascular:     Rate and Rhythm: Normal rate and regular rhythm.     Heart sounds: Normal heart sounds.  Pulmonary:     Effort: Pulmonary effort is normal.     Breath sounds: Normal  breath sounds.  Musculoskeletal:        General: Swelling, tenderness and signs of injury  present. No deformity. Normal range of motion.     Cervical back: Normal range of motion and neck supple.     Comments: Right ankle- Tenderness palpation around both lateral and medial malleolus Mild to moderate swelling around lateral malleolus Range of motion intact but painful subjectively  Skin:    General: Skin is warm and dry.     Findings: No bruising, erythema or lesion.  Neurological:     Mental Status: She is alert and oriented to person, place, and time.     Sensory: No sensory deficit.     Motor: No weakness.     Gait: Gait abnormal.     Comments: Antalgic gait Right lower extremity neurovascularly intact  Psychiatric:        Mood and Affect: Mood normal.        Thought Content: Thought content normal.        Judgment: Judgment normal.      UC Treatments / Results  Labs (all labs ordered are listed, but only abnormal results are displayed) Labs Reviewed - No data to display  EKG   Radiology DG Ankle Complete Right  Result Date: 09/28/2020 CLINICAL DATA:  Right ankle pain after fall. EXAM: RIGHT ANKLE - COMPLETE 3+ VIEW COMPARISON:  08/09/2012. FINDINGS: Diffuse soft tissue swelling, particularly prominent over the lateral malleolus. No acute bony or joint abnormality. No evidence of fracture or dislocation. IMPRESSION: Soft tissue swelling, particularly prominent about the lateral malleolus. No acute bony abnormality identified. Electronically Signed   By: Marcello Moores  Register   On: 09/28/2020 09:42    Procedures Procedures (including critical care time)  Medications Ordered in UC Medications - No data to display  Initial Impression / Assessment and Plan / UC Course  I have reviewed the triage vital signs and the nursing notes.  Pertinent labs & imaging results that were available during my care of the patient were reviewed by me and considered in my medical decision  making (see chart for details).     X-ray right ankle today showing no acute bony abnormality.  Suspect ankle sprain from her fall.  Discussed RICE protocol, over-the-counter pain relievers, rest.  Work note given.  Follow-up with primary care if not fully resolving in the next few weeks.  Also discussed her elevated blood pressure, discussed consistency with medications, DASH diet, stress reduction.  Likely secondary to her ankle pain today.  Will follow up with primary care for a recheck next week if still elevated at home readings.  Final Clinical Impressions(s) / UC Diagnoses   Final diagnoses:  Sprain of right ankle, unspecified ligament, initial encounter  Elevated blood pressure reading   Discharge Instructions   None    ED Prescriptions    None     PDMP not reviewed this encounter.   Merrie Roof [place], [place] 09/28/20 [phone]

## 2020-09-28 NOTE — ED Triage Notes (Signed)
Pt in with c/o right ankle pain that started yesterday after she fell  Pt has wrapped ankle in ace bandage for support  Swelling noted, no bruising

## 2020-10-12 ENCOUNTER — Other Ambulatory Visit: Payer: Self-pay

## 2020-10-12 ENCOUNTER — Ambulatory Visit (INDEPENDENT_AMBULATORY_CARE_PROVIDER_SITE_OTHER): Payer: BC Managed Care – PPO | Admitting: Family Medicine

## 2020-10-12 ENCOUNTER — Encounter: Payer: Self-pay | Admitting: Family Medicine

## 2020-10-12 VITALS — BP 135/88 | HR 73 | Temp 97.3°F | Ht 64.5 in | Wt 180.4 lb

## 2020-10-12 DIAGNOSIS — E282 Polycystic ovarian syndrome: Secondary | ICD-10-CM | POA: Diagnosis not present

## 2020-10-12 DIAGNOSIS — E669 Obesity, unspecified: Secondary | ICD-10-CM

## 2020-10-12 DIAGNOSIS — Z0000 Encounter for general adult medical examination without abnormal findings: Secondary | ICD-10-CM

## 2020-10-12 DIAGNOSIS — I10 Essential (primary) hypertension: Secondary | ICD-10-CM | POA: Diagnosis not present

## 2020-10-12 MED ORDER — METOPROLOL SUCCINATE ER 50 MG PO TB24: 50.0000 mg | ORAL_TABLET | Freq: Every day | ORAL | 3 refills | Status: DC

## 2020-10-12 MED ORDER — ATORVASTATIN CALCIUM 10 MG PO TABS: 1.0000 | ORAL_TABLET | Freq: Every day | ORAL | 3 refills | Status: DC

## 2020-10-12 MED ORDER — HYDROCHLOROTHIAZIDE 25 MG PO TABS: 1.0000 | ORAL_TABLET | Freq: Every day | ORAL | 3 refills | Status: DC

## 2020-10-12 MED ORDER — FAMOTIDINE 20 MG PO TABS: 20.0000 mg | ORAL_TABLET | Freq: Two times a day (BID) | ORAL | 3 refills | Status: DC

## 2020-10-12 MED ORDER — POTASSIUM CHLORIDE ER 10 MEQ PO TBCR: 10.0000 meq | EXTENDED_RELEASE_TABLET | Freq: Every day | ORAL | 3 refills | Status: DC

## 2020-10-12 MED ORDER — FLUTICASONE PROPIONATE 50 MCG/ACT NA SUSP: NASAL | 3 refills | Status: DC

## 2020-10-12 NOTE — Assessment & Plan Note (Signed)
Discussed how this problem influences overall health and the risks it imposes  Reviewed plan for weight loss with lower calorie diet (via better food choices and also portion control or program like weight watchers) and exercise building up to or more than 30 minutes 5 days per week including some aerobic activity   commended wt loss so far

## 2020-10-12 NOTE — Patient Instructions (Addendum)
I recommend you get a covid booster   Stop at check out to sign a release so we can get your gyn reports   Keep taking care of yourself   Labs look good

## 2020-10-12 NOTE — Assessment & Plan Note (Signed)
Lab Results  Component Value Date   HGBA1C 5.1 09/14/2020   Reassuring  Encouraged low glycemic diet

## 2020-10-12 NOTE — Assessment & Plan Note (Signed)
Reviewed health habits including diet and exercise and skin cancer prevention Reviewed appropriate screening tests for age  Also reviewed health mt list, fam hx and immunization status , as well as social and family history   Sent for last mammogram and pap report from gyn in October  Missed flu shot this season  Encouraged her to get a covid booster  No new family history

## 2020-10-12 NOTE — Assessment & Plan Note (Signed)
bp in fair control at this time  BP Readings from Last 1 Encounters:  10/12/20 135/88   No changes needed Most recent labs reviewed  Disc lifstyle change with low sodium diet and exercise  Plan to continue hctz 25 mg daily and metoprolol xl 50 mg daily

## 2020-10-12 NOTE — Progress Notes (Signed)
Subjective:    Patient ID: Leslie Campbell, female    DOB: January 23, 1976, 45 y.o.   MRN: [id number]  This visit occurred during the SARS-CoV-2 public health emergency.  Safety protocols were in place, including screening questions prior to the visit, additional usage of staff PPE, and extensive cleaning of exam room while observing appropriate contact time as indicated for disinfecting solutions.    HPI Here for health maintenance exam and to review chronic medical problems   Wt Readings from Last 3 Encounters:  10/12/20 180 lb 7 oz (81.8 kg)  09/01/19 183 lb 7 oz (83.2 kg)  08/15/18 210 lb 2 oz (95.3 kg)   30.49 kg/m Working Doing well  Continues working on [employer]  She drinks protein drinks in the am   Has a teenager and a pre K age child-challenging   Very physical job/delivers mail   Taking good care of herself   Flu shot-not this season covid immunized but not boosted yet (she does get fever with the booster)   Tdap 4/18  Mammogram -per pt October 2021 at gyn Dr Edwyna Ready  Self breast exam- no lumps   Pap 10/21 Dr Edwyna Ready   HTN bp is stable today  No cp or palpitations or headaches or edema  No side effects to medicines  BP Readings from Last 3 Encounters:  10/12/20 135/88  09/28/20 (!) 188/102  09/01/19 130/88    Taking hctz 25 mg daily (has not had yet today)  Metoprolol xl 50 mg daily   Lab Results  Component Value Date   CREATININE 0.87 09/14/2020   BUN 15 09/14/2020   NA 139 09/14/2020   K 4.4 09/14/2020   CL 105 09/14/2020   CO2 29 09/14/2020    Lab Results  Component Value Date   ALT 20 09/14/2020   AST 20 09/14/2020   ALKPHOS 74 03/13/2017   BILITOT 1.0 09/14/2020   Lab Results  Component Value Date   WBC 3.1 (L) 09/14/2020   HGB 14.2 09/14/2020   HCT 42.4 09/14/2020   MCV 95.7 09/14/2020   PLT 235 09/14/2020     H/o pcos Lab Results  Component Value Date   HGBA1C 5.1 09/14/2020    Hyperlipidemia Lab Results  Component Value  Date   CHOL 137 09/14/2020   CHOL 145 08/25/2019   CHOL 151 01/09/2019   Lab Results  Component Value Date   HDL 62 09/14/2020   HDL 57 08/25/2019   HDL 48 (L) 01/09/2019   Lab Results  Component Value Date   LDLCALC 62 09/14/2020   LDLCALC 74 08/25/2019   LDLCALC 85 01/09/2019   Lab Results  Component Value Date   TRIG 51 09/14/2020   TRIG 68 08/25/2019   TRIG 87 01/09/2019   Lab Results  Component Value Date   CHOLHDL 2.2 09/14/2020   CHOLHDL 2.5 08/25/2019   CHOLHDL 3.1 01/09/2019   Lab Results  Component Value Date   LDLDIRECT 169.5 08/03/2008   Taking atorvastatin 10 mg daily  Weight loss and better diet    Patient Active Problem List   Diagnosis Date Noted  . Chronic cough 08/09/2017  . Routine general medical examination at a health care facility 03/13/2017  . Carpal tunnel syndrome 03/13/2017  . S/P cesarean section, indication: Active phase arrest  12/04/2016  . Postpartum care following cesarean delivery (6/5) 12/04/2016  . GDM, class A1 12/04/2016  . Chronic hypertension affecting pregnancy 12/04/2016  . IUGR (intrauterine growth restriction) affecting care of  mother, third trimester, fetus 1 12/04/2016  . Allergic rhinitis 04/20/2015  . Encounter for health maintenance examination 04/18/2014  . Obesity (BMI 30-39.9) 06/10/2009  . POLYCYSTIC OVARIAN DISEASE 03/15/2009  . BACK PAIN, LUMBAR, WITH RADICULOPATHY 09/09/2008  . Hyperlipidemia 08/03/2008  . HYPERTENSION, BENIGN ESSENTIAL 08/03/2008   Past Medical History:  Diagnosis Date  . HTN (hypertension)   . Hyperlipidemia    no meds  . Infertility associated with anovulation   . Infertility, female    clomid  . Migraine    otc med prn  . Miscarriage 01/2010  . Miscarriage 2012   x 5 - only 1 required surgery  . PCOS (polycystic ovarian syndrome)   . 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 CCY    . CERVICAL CERCLAGE N/A 06/22/2016    Procedure: Linna Caprice CERVICAL;  Surgeon: Brien Few, MD;  Location: [facility];  Service: Gynecology;  Laterality: N/A;  EDD: 12/25/16  . CESAREAN SECTION N/A 12/04/2016   Procedure: CESAREAN SECTION;  Surgeon: Brien Few, MD;  Location: [facility];  Service: Obstetrics;  Laterality: N/A;  . CHOLECYSTECTOMY    . DILATION AND CURETTAGE OF UTERUS     MAB  . LAPAROSCOPIC LYSIS OF ADHESIONS  01/21/2017   Procedure: LAPAROSCOPIC LYSIS OF ADHESIONS;  Surgeon: Brien Few, MD;  Location: [facility];  Service: Gynecology;;  . LAPAROSCOPIC OVARIAN CYSTECTOMY Bilateral   . LAPAROSCOPIC TUBAL LIGATION Bilateral 01/21/2017   Procedure: LAPAROSCOPIC TUBAL LIGATION;  Surgeon: Brien Few, MD;  Location: [facility];  Service: Gynecology;  Laterality: Bilateral;  . miscarriage  01/2010  . WISDOM TOOTH EXTRACTION     Social History   Tobacco Use  . Smoking status: Never Smoker  . Smokeless tobacco: Never Used  Vaping Use  . Vaping Use: Never used  Substance Use Topics  . Alcohol use: No    Alcohol/week: 0.0 standard drinks  . Drug use: No   Family History  Problem Relation Age of Onset  . Hypertension Mother   . Hypertension Father   . Diabetes Brother   . Diabetes Other    No Known Allergies Current Outpatient Medications on File Prior to Visit  Medication Sig Dispense Refill  . albuterol (VENTOLIN HFA) 108 (90 Base) MCG/ACT inhaler INHALE 2 PUFFS INTO THE LUNGS EVERY 4 (FOUR) HOURS AS NEEDED (COUGH, TIGHT CHEST OR WHEEZING). 18 each 3   No current facility-administered medications on file prior to visit.    Review of Systems  Constitutional: Positive for fatigue. Negative for activity change, appetite change, fever and unexpected weight change.  HENT: Negative for congestion, ear pain, rhinorrhea, sinus pressure and sore throat.   Eyes: Negative for pain, redness and visual disturbance.  Respiratory: Negative for cough, shortness of breath and wheezing.   Cardiovascular:  Negative for chest pain and palpitations.  Gastrointestinal: Negative for abdominal pain, blood in stool, constipation and diarrhea.  Endocrine: Negative for polydipsia and polyuria.  Genitourinary: Negative for dysuria, frequency and urgency.  Musculoskeletal: Negative for arthralgias, back pain and myalgias.  Skin: Negative for pallor and rash.  Allergic/Immunologic: Negative for environmental allergies.  Neurological: Negative for dizziness, syncope and headaches.  Hematological: Negative for adenopathy. Does not bruise/bleed easily.  Psychiatric/Behavioral: Negative for decreased concentration and dysphoric mood. The patient is not nervous/anxious.        Objective:   Physical Exam Constitutional:      General: She is not in acute distress.    Appearance:  Normal appearance. She is well-developed. She is obese. She is not ill-appearing or diaphoretic.  HENT:     Head: Normocephalic and atraumatic.     Right Ear: Tympanic membrane, ear canal and external ear normal.     Left Ear: Tympanic membrane, ear canal and external ear normal.     Nose: Nose normal. No congestion.     Mouth/Throat:     Mouth: Mucous membranes are moist.     Pharynx: Oropharynx is clear. No posterior oropharyngeal erythema.  Eyes:     General: No scleral icterus.    Extraocular Movements: Extraocular movements intact.     Conjunctiva/sclera: Conjunctivae normal.     Pupils: Pupils are equal, round, and reactive to light.  Neck:     Thyroid: No thyromegaly.     Vascular: No carotid bruit or JVD.  Cardiovascular:     Rate and Rhythm: Normal rate and regular rhythm.     Pulses: Normal pulses.     Heart sounds: Normal heart sounds. No gallop.   Pulmonary:     Effort: Pulmonary effort is normal. No respiratory distress.     Breath sounds: Normal breath sounds. No wheezing.     Comments: Good air exch Chest:     Chest wall: No tenderness.  Abdominal:     General: Bowel sounds are normal. There is no  distension or abdominal bruit.     Palpations: Abdomen is soft. There is no mass.     Tenderness: There is no abdominal tenderness.     Hernia: No hernia is present.  Genitourinary:    Comments: Breast and pelvic exam are done by gyn provider  Musculoskeletal:        General: No tenderness. Normal range of motion.     Cervical back: Normal range of motion and neck supple. No rigidity. No muscular tenderness.     Right lower leg: No edema.     Left lower leg: No edema.     Comments: No kyphosis   Lymphadenopathy:     Cervical: No cervical adenopathy.  Skin:    General: Skin is warm and dry.     Coloration: Skin is not pale.     Findings: No erythema or rash.     Comments: Few skin tags  Neurological:     Mental Status: She is alert. Mental status is at baseline.     Cranial Nerves: No cranial nerve deficit.     Motor: No abnormal muscle tone.     Coordination: Coordination normal.     Gait: Gait normal.     Deep Tendon Reflexes: Reflexes are normal and symmetric. Reflexes normal.  Psychiatric:        Mood and Affect: Mood normal.        Cognition and Memory: Cognition and memory normal.           Assessment & Plan:   Problem List Items Addressed This Visit      Cardiovascular and Mediastinum   HYPERTENSION, BENIGN ESSENTIAL    bp in fair control at this time  BP Readings from Last 1 Encounters:  10/12/20 135/88   No changes needed Most recent labs reviewed  Disc lifstyle change with low sodium diet and exercise  Plan to continue hctz 25 mg daily and metoprolol xl 50 mg daily       Relevant Medications   atorvastatin (LIPITOR) 10 MG tablet   hydrochlorothiazide (HYDRODIURIL) 25 MG tablet   metoprolol succinate (TOPROL-XL) 50 MG 24 hr  tablet     Endocrine   POLYCYSTIC OVARIAN DISEASE    Lab Results  Component Value Date   HGBA1C 5.1 09/14/2020   Reassuring  Encouraged low glycemic diet         Other   Obesity (BMI 30-39.9)    Discussed how this  problem influences overall health and the risks it imposes  Reviewed plan for weight loss with lower calorie diet (via better food choices and also portion control or program like weight watchers) and exercise building up to or more than 30 minutes 5 days per week including some aerobic activity   commended wt loss so far       Routine general medical examination at a health care facility - Primary    Reviewed health habits including diet and exercise and skin cancer prevention Reviewed appropriate screening tests for age  Also reviewed health mt list, fam hx and immunization status , as well as social and family history   Sent for last mammogram and pap report from gyn in October  Missed flu shot this season  Encouraged her to get a covid booster  No new family history

## 2020-11-26 ENCOUNTER — Other Ambulatory Visit: Payer: Self-pay | Admitting: Family Medicine

## 2021-01-24 ENCOUNTER — Ambulatory Visit: Payer: BC Managed Care – PPO | Admitting: Family Medicine

## 2021-01-24 ENCOUNTER — Other Ambulatory Visit: Payer: Self-pay

## 2021-01-24 ENCOUNTER — Encounter: Payer: Self-pay | Admitting: Family Medicine

## 2021-01-24 VITALS — BP 140/80 | HR 74 | Temp 98.1°F | Ht 64.5 in | Wt 179.2 lb

## 2021-01-24 DIAGNOSIS — M25571 Pain in right ankle and joints of right foot: Secondary | ICD-10-CM | POA: Diagnosis not present

## 2021-01-24 DIAGNOSIS — K5900 Constipation, unspecified: Secondary | ICD-10-CM | POA: Insufficient documentation

## 2021-01-24 NOTE — Patient Instructions (Signed)
Stop aleve  Tylenol is ok  Use ice when you can  Elevate when you can   Get voltaren gel over the counter  Use up to 4 times daily on painful area  We will make an appt with Dr Lorelei Pont (sport med)   For constipation Stop aleve  Drink more water  Take miralax 2-3 times daily until you start having bowel movements  Can try senekot also if needed   Update if not starting to improve in a week or if worsening

## 2021-01-24 NOTE — Assessment & Plan Note (Signed)
Since inversion injury in march with neg xray  Lateral pain /posterior  Mild persistent swelling  Adv to ice/elevate and continue compression  Change from oral nsaid to voltaren gel  rom exercises as tolerated  Ref to sport med

## 2021-01-24 NOTE — Progress Notes (Signed)
Subjective:    Patient ID: Leslie Campbell, female    DOB: [birth year], 45 y.o.   MRN: [id number]  This visit occurred during the SARS-CoV-2 public health emergency.  Safety protocols were in place, including screening questions prior to the visit, additional usage of staff PPE, and extensive cleaning of exam room while observing appropriate contact time as indicated for disinfecting solutions.   HPI Pt presents for ankle injury and constipation   Wt Readings from Last 3 Encounters:  01/24/21 179 lb 4 oz (81.3 kg)  10/12/20 180 lb 7 oz (81.8 kg)  09/01/19 183 lb 7 oz (83.2 kg)   30.29 kg/m  Ankle was hurt in march - stepped off of a stone (fell)  Inverted it  Swelled severely and had bruising  Went to UC-not broken   Now still swollen  Pain is lateral and posterior   Uses a brace  Heat helps  Ice does not help any more   Otc - aleve , tylenol  Elevates it at night  Wakes her up at night   DG Ankle Complete Right (Accession [id number]) (Order [id number]) Imaging Date: 09/28/2020 Department: [facility] Released By/Authorizing: Volney American, PA-C (auto-released)    Exam Status  Status  Final [99]   PACS Intelerad Image Link   Show images for DG Ankle Complete Right  Study Result  Narrative & Impression  CLINICAL DATA:  Right ankle pain after fall.   EXAM: RIGHT ANKLE - COMPLETE 3+ VIEW   COMPARISON:  08/09/2012.   FINDINGS: Diffuse soft tissue swelling, particularly prominent over the lateral malleolus. No acute bony or joint abnormality. No evidence of fracture or dislocation.   IMPRESSION: Soft tissue swelling, particularly prominent about the lateral malleolus. No acute bony abnormality identified.     Electronically Signed   By: Marcello Moores  Register   On: 09/28/2020 09:42    9 y ago bad sprain also    Constipation  Feels like she has to go and then can only go a little  Stool is not hard  Just not moving   Makes her feel nauseated   ? Cause  She ate grits with cheese the day she ate it - cramped after that  About a week ago  She bought some miralax and used one day  Fluid intake- during the day better than at night  Fiber intake-lots of fruit veg  No fam hx of colon problems   Patient Active Problem List   Diagnosis Date Noted   Right ankle pain 01/24/2021   Constipation 01/24/2021   Chronic cough 08/09/2017   Routine general medical examination at a health care facility 03/13/2017   Carpal tunnel syndrome 03/13/2017   S/P cesarean section, indication: Active phase arrest  12/04/2016   Postpartum care following cesarean delivery (6/5) 12/04/2016   GDM, class A1 12/04/2016   Chronic hypertension affecting pregnancy 12/04/2016   IUGR (intrauterine growth restriction) affecting care of mother, third trimester, fetus 1 12/04/2016   Allergic rhinitis 04/20/2015   Encounter for health maintenance examination 04/18/2014   Obesity (BMI 30-39.9) 06/10/2009   POLYCYSTIC OVARIAN DISEASE 03/15/2009   BACK PAIN, LUMBAR, WITH RADICULOPATHY 09/09/2008   Hyperlipidemia 08/03/2008   HYPERTENSION, BENIGN ESSENTIAL 08/03/2008   Past Medical History:  Diagnosis Date   HTN (hypertension)    Hyperlipidemia    no meds   Infertility associated with anovulation    Infertility, female    clomid   Migraine    otc  med prn   Miscarriage 01/2010   Miscarriage 2012   x 5 - only 1 required surgery   PCOS (polycystic ovarian syndrome)    Pituitary hyperfunction [facility])    Past Surgical History:  Procedure Laterality Date   CCY     CERVICAL CERCLAGE N/A 06/22/2016   Procedure: Linna Caprice CERVICAL;  Surgeon: Brien Few, MD;  Location: [facility];  Service: Gynecology;  Laterality: N/A;  EDD: 12/25/16   CESAREAN SECTION N/A 12/04/2016   Procedure: CESAREAN SECTION;  Surgeon: Brien Few, MD;  Location: [facility];  Service: Obstetrics;  Laterality: N/A;   CHOLECYSTECTOMY     DILATION  AND CURETTAGE OF UTERUS     MAB   LAPAROSCOPIC LYSIS OF ADHESIONS  01/21/2017   Procedure: LAPAROSCOPIC LYSIS OF ADHESIONS;  Surgeon: Brien Few, MD;  Location: [facility];  Service: Gynecology;;   LAPAROSCOPIC OVARIAN CYSTECTOMY Bilateral    LAPAROSCOPIC TUBAL LIGATION Bilateral 01/21/2017   Procedure: LAPAROSCOPIC TUBAL LIGATION;  Surgeon: Brien Few, MD;  Location: [facility];  Service: Gynecology;  Laterality: Bilateral;   miscarriage  01/2010   WISDOM TOOTH EXTRACTION     Social History   Tobacco Use   Smoking status: Never   Smokeless tobacco: Never  Vaping Use   Vaping Use: Never used  Substance Use Topics   Alcohol use: No    Alcohol/week: 0.0 standard drinks   Drug use: No   Family History  Problem Relation Age of Onset   Hypertension Mother    Hypertension Father    Diabetes Brother    Diabetes Other    No Known Allergies Current Outpatient Medications on File Prior to Visit  Medication Sig Dispense Refill   albuterol (VENTOLIN HFA) 108 (90 Base) MCG/ACT inhaler INHALE 2 PUFFS INTO THE LUNGS EVERY 4 (FOUR) HOURS AS NEEDED (COUGH, TIGHT CHEST OR WHEEZING). 18 each 3   atorvastatin (LIPITOR) 10 MG tablet Take 1 tablet (10 mg total) by mouth daily. 90 tablet 3   famotidine (PEPCID) 20 MG tablet Take 1 tablet (20 mg total) by mouth 2 (two) times daily. 180 tablet 3   fluticasone (FLONASE) 50 MCG/ACT nasal spray SPRAY 2 SPRAYS INTO EACH NOSTRIL EVERY DAY 48 mL 3   hydrochlorothiazide (HYDRODIURIL) 25 MG tablet Take 1 tablet (25 mg total) by mouth daily. 90 tablet 3   metoprolol succinate (TOPROL-XL) 50 MG 24 hr tablet Take 1 tablet (50 mg total) by mouth daily. Take with or immediately following a meal. 90 tablet 3   potassium chloride (KLOR-CON) 10 MEQ tablet Take 1 tablet (10 mEq total) by mouth daily. 90 tablet 3   No current facility-administered medications on file prior to visit.    Review of Systems  Constitutional:  Negative for activity change, appetite  change, fatigue, fever and unexpected weight change.  HENT:  Negative for congestion, ear pain, rhinorrhea, sinus pressure and sore throat.   Eyes:  Negative for pain, redness and visual disturbance.  Respiratory:  Negative for cough, shortness of breath and wheezing.   Cardiovascular:  Negative for chest pain and palpitations.  Gastrointestinal:  Positive for constipation. Negative for abdominal pain, blood in stool and diarrhea.       Bloating  Endocrine: Negative for polydipsia and polyuria.  Genitourinary:  Negative for dysuria, frequency and urgency.  Musculoskeletal:  Negative for arthralgias, back pain and myalgias.       Right ankle pain   Skin:  Negative for pallor and rash.  Allergic/Immunologic: Negative for environmental allergies.  Neurological:  Negative for dizziness, syncope and headaches.  Hematological:  Negative for adenopathy. Does not bruise/bleed easily.  Psychiatric/Behavioral:  Negative for decreased concentration and dysphoric mood. The patient is not nervous/anxious.       Objective:   Physical Exam Constitutional:      General: She is not in acute distress.    Appearance: Normal appearance. She is well-developed. She is obese. She is not ill-appearing or diaphoretic.  HENT:     Head: Normocephalic and atraumatic.  Eyes:     General: No scleral icterus.    Conjunctiva/sclera: Conjunctivae normal.     Pupils: Pupils are equal, round, and reactive to light.  Cardiovascular:     Rate and Rhythm: Normal rate and regular rhythm.     Heart sounds: Normal heart sounds.  Pulmonary:     Effort: Pulmonary effort is normal. No respiratory distress.     Breath sounds: Normal breath sounds. No wheezing or rales.  Abdominal:     General: Abdomen is flat. Bowel sounds are normal. There is no distension.     Palpations: Abdomen is soft. There is no hepatomegaly, splenomegaly or mass.     Tenderness: There is no abdominal tenderness. There is no guarding or rebound.      Hernia: No hernia is present.  Musculoskeletal:     Cervical back: Normal range of motion and neck supple.     Right ankle: Swelling present. No deformity or ecchymosis. Tenderness present over the lateral malleolus, ATF ligament and posterior TF ligament. No base of 5th metatarsal or proximal fibula tenderness. Normal range of motion. Anterior drawer test negative. Normal pulse.     Right Achilles Tendon: Normal. No tenderness or defects. Thompson's test negative.     Comments: Pt bears weight on R foot with some discomfort   Lymphadenopathy:     Cervical: No cervical adenopathy.  Skin:    General: Skin is warm and dry.     Coloration: Skin is not pale.     Findings: No erythema.  Neurological:     Mental Status: She is alert.          Assessment & Plan:   Problem List Items Addressed This Visit       Other   Right ankle pain - Primary    Since inversion injury in march with neg xray  Lateral pain /posterior  Mild persistent swelling  Adv to ice/elevate and continue compression  Change from oral nsaid to voltaren gel  rom exercises as tolerated  Ref to sport med        Constipation    Suspect worsened from recent aleve use (could also have some early gastritis) Adv to stop oral nsaid  Inc fluids Continue diet fiber/produce Start miralax 2-3 times daily until bowels begin to move Add senekot if needed  Update if not starting to improve in a week or if worsening  Handout given

## 2021-01-24 NOTE — Assessment & Plan Note (Signed)
Suspect worsened from recent aleve use (could also have some early gastritis) Adv to stop oral nsaid  Inc fluids Continue diet fiber/produce Start miralax 2-3 times daily until bowels begin to move Add senekot if needed  Update if not starting to improve in a week or if worsening  Handout given

## 2021-01-31 ENCOUNTER — Ambulatory Visit: Payer: BC Managed Care – PPO | Admitting: Family Medicine

## 2021-01-31 NOTE — Progress Notes (Signed)
Eugean Arnott T. Mariadejesus Cade, MD, [place] at [facility] [place], [address]  Phone: [phone]  FAX: [place] - 45 y.o. female  MRN [id number]  Date of Birth: 06-09-1976  Date: 02/01/2021  PCP: Abner Greenspan, MD  Referral: Abner Greenspan, MD  Chief Complaint  Patient presents with   Ankle Pain    Right-Fell back in March. Still hurting    This visit occurred during the SARS-CoV-2 public health emergency.  Safety protocols were in place, including screening questions prior to the visit, additional usage of staff PPE, and extensive cleaning of exam room while observing appropriate contact time as indicated for disinfecting solutions.   Subjective:   Leslie Campbell is a 45 y.o. very pleasant female patient with Body mass index is 30.5 kg/m. who presents with the following:  Acute R ankle pain after 08/2020 pain and a negative prior x-ray with no fracture.   Stepped off of a stepping stone, and then had a lateral inversion.  She took 5 days off of work, then she returned.  She does work as a Development worker, community carrier, and she does ride a delivery truck, but she does have to ambulate throughout the entirety of the day.  She does have some lateral swelling currently, and this is causing her some pain medially and laterally.  No recent trauma or reinjury.  She has had a severe ankle sprain approximately 10 years ago on the affected ankle.  Review of Systems is noted in the HPI, as appropriate   Objective:   BP (!) 150/90   Pulse (!) 57   Temp 98.3 F (36.8 C) (Temporal)   Ht 5' 4.5" (1.638 m)   Wt 180 lb 8 oz (81.9 kg)   LMP 01/13/2021   SpO2 97%   BMI 30.50 kg/m   Walks without any significant limp.  Gets up to the table without any difficulty.  Right ankle: Nontender throughout all toes, metatarsals, and entirety of the midfoot. She has modest tenderness at the medial greater than lateral malleolus.   Nontender throughout the tibia and fibula.  She does have some mild tenderness along the peroneal tendon as well as the posterior tibialis tendon.  She does have an approximately 35% loss of motion compared to the contralateral side. In all directions she has 4/5 strength compared to 5/5 in the contralateral ankle.  Proprioception is poor compared to the contralateral side.  Radiology: No results found.  Assessment and Plan:     ICD-10-CM   1. Acute right ankle pain  M25.571 Ambulatory referral to Physical Therapy    2. Weakness of right lower extremity  R29.898 Ambulatory referral to Physical Therapy    3. Decreased range of motion of right ankle  M25.671 Ambulatory referral to Physical Therapy     Post ankle sprain with exacerbation of initial injury, the patient does have some significant weakness and loss of range of motion, combined with some poor proprioception compared to contralateral side.  A rehabilitation program from the [employer] was reviewed with the patient face to face for their condition.   Additionally, formal physical therapy would help quite a bit in this case.  Also gave her an ASO ankle brace.  While working.  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  Orders Placed This Encounter  Procedures   Ambulatory referral to Physical Therapy  Follow-up: Return in about 7 weeks (around 03/22/2021).  Signed,  Maud Deed. Ronne Stefanski, MD   Outpatient Encounter Medications as of 02/01/2021  Medication Sig   albuterol (VENTOLIN HFA) 108 (90 Base) MCG/ACT inhaler INHALE 2 PUFFS INTO THE LUNGS EVERY 4 (FOUR) HOURS AS NEEDED (COUGH, TIGHT CHEST OR WHEEZING).   atorvastatin (LIPITOR) 10 MG tablet Take 1 tablet (10 mg total) by mouth daily.   famotidine (PEPCID) 20 MG tablet Take 1 tablet (20 mg total) by mouth 2 (two) times daily.   fluticasone (FLONASE) 50 MCG/ACT nasal spray SPRAY 2 SPRAYS INTO EACH  NOSTRIL EVERY DAY   hydrochlorothiazide (HYDRODIURIL) 25 MG tablet Take 1 tablet (25 mg total) by mouth daily.   metoprolol succinate (TOPROL-XL) 50 MG 24 hr tablet Take 1 tablet (50 mg total) by mouth daily. Take with or immediately following a meal.   potassium chloride (KLOR-CON) 10 MEQ tablet Take 1 tablet (10 mEq total) by mouth daily.   No facility-administered encounter medications on file as of 02/01/2021.

## 2021-02-01 ENCOUNTER — Ambulatory Visit: Payer: BC Managed Care – PPO | Admitting: Family Medicine

## 2021-02-01 ENCOUNTER — Other Ambulatory Visit: Payer: Self-pay

## 2021-02-01 ENCOUNTER — Encounter: Payer: Self-pay | Admitting: Family Medicine

## 2021-02-01 VITALS — BP 150/90 | HR 57 | Temp 98.3°F | Ht 64.5 in | Wt 180.5 lb

## 2021-02-01 DIAGNOSIS — R29898 Other symptoms and signs involving the musculoskeletal system: Secondary | ICD-10-CM

## 2021-02-01 DIAGNOSIS — M25671 Stiffness of right ankle, not elsewhere classified: Secondary | ICD-10-CM

## 2021-02-01 DIAGNOSIS — M25571 Pain in right ankle and joints of right foot: Secondary | ICD-10-CM | POA: Diagnosis not present

## 2021-03-07 ENCOUNTER — Ambulatory Visit: Payer: BC Managed Care – PPO | Admitting: Rehabilitative and Restorative Service Providers"

## 2021-03-07 ENCOUNTER — Encounter: Payer: Self-pay | Admitting: Rehabilitative and Restorative Service Providers"

## 2021-03-07 ENCOUNTER — Other Ambulatory Visit: Payer: Self-pay

## 2021-03-07 DIAGNOSIS — R600 Localized edema: Secondary | ICD-10-CM

## 2021-03-07 DIAGNOSIS — M25571 Pain in right ankle and joints of right foot: Secondary | ICD-10-CM

## 2021-03-07 DIAGNOSIS — M6281 Muscle weakness (generalized): Secondary | ICD-10-CM

## 2021-03-07 DIAGNOSIS — R262 Difficulty in walking, not elsewhere classified: Secondary | ICD-10-CM

## 2021-03-07 NOTE — Patient Instructions (Signed)
Access Code: [email or web address] URL: [email or web address] Date: 03/07/2021 Prepared by: Scot Jun  Exercises Long Sitting Ankle Eversion with Resistance - 2 x daily - 7 x weekly - 3 sets - 10 reps Long Sitting Ankle Plantar Flexion with Resistance - 2 x daily - 7 x weekly - 3 sets - 10 reps Long Sitting Ankle Inversion with Resistance - 2 x daily - 7 x weekly - 3 sets - 10 reps Long Sitting Ankle Dorsiflexion with Anchored Resistance - 2 x daily - 7 x weekly - 3 sets - 10 reps Single Leg Stance - 2 x daily - 7 x weekly - 1 sets - 5 reps - 30 hold Standing Dorsiflexion Self-Mobilization on Step - 2 x daily - 7 x weekly - 3 sets - 10 reps - 2-3 hold

## 2021-03-07 NOTE — Therapy (Addendum)
[facility] [address] [place], [place], [address] Phone: [phone]   Fax:  [phone]  Physical Therapy Evaluation  /Discharge   Patient Details  Name: Leslie Campbell MRN: [id number] Date of Birth: 02-12-1976 Referring Provider (PT): Owens Loffler, MD   Encounter Date: 03/07/2021   PT End of Session - 03/07/21 1136     Visit Number 1    Number of Visits 6    Date for PT Re-Evaluation 04/18/21    Authorization Type BC[employer] 20%    Progress Note Due on Visit --    PT Start Time 1140    PT Stop Time 1212    PT Time Calculation (min) 32 min    Activity Tolerance Patient tolerated treatment well    Behavior During Therapy [facility] for tasks assessed/performed             Past Medical History:  Diagnosis Date   HTN (hypertension)    Hyperlipidemia    no meds   Infertility associated with anovulation    Infertility, female    clomid   Migraine    otc med prn   Miscarriage 01/2010   Miscarriage 2012   x 5 - only 1 required surgery   PCOS (polycystic ovarian syndrome)    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[place])     Past Surgical History:  Procedure Laterality Date   CCY     CERVICAL CERCLAGE N/A 06/22/2016   Procedure: Linna Caprice CERVICAL;  Surgeon: Brien Few, MD;  Location: [facility];  Service: Gynecology;  Laterality: N/A;  EDD: 12/25/16   CESAREAN SECTION N/A 12/04/2016   Procedure: CESAREAN SECTION;  Surgeon: Brien Few, MD;  Location: [facility];  Service: Obstetrics;  Laterality: N/A;   CHOLECYSTECTOMY     DILATION AND CURETTAGE OF UTERUS     MAB   LAPAROSCOPIC LYSIS OF ADHESIONS  01/21/2017   Procedure: LAPAROSCOPIC LYSIS OF ADHESIONS;  Surgeon: Brien Few, MD;  Location: [facility];  Service: Gynecology;;   LAPAROSCOPIC OVARIAN CYSTECTOMY Bilateral    LAPAROSCOPIC TUBAL LIGATION Bilateral 01/21/2017   Procedure: LAPAROSCOPIC TUBAL LIGATION;  Surgeon: Brien Few, MD;  Location: [facility];  Service: Gynecology;   Laterality: Bilateral;   miscarriage  01/2010   WISDOM TOOTH EXTRACTION      There were no vitals filed for this visit.    Subjective Assessment - 03/07/21 1143     Subjective Pt. indicated she was walking stairs and stepped wrong resulting in fall in early April.  Pt. indicated previous injury approx. 10 years ago.  Pt. stated swelling was noted and pain.  Saw Urgent Care initially with no fracture noted.  Pt. indicated work as Development worker, community delivery with standing/walking required.  Pt. received lace up brace since last MD visit.  Wearing on trouble days.    Limitations Standing;Walking;House hold activities    Patient Stated Goals Reduce pain    Currently in Pain? Yes    Pain Score 7    pain at worst 7/10   Pain Location Ankle    Pain Orientation Right;Medial    Pain Descriptors / Indicators Aching;Throbbing    Pain Type Chronic pain    Pain Onset More than a month ago    Pain Frequency Intermittent    Aggravating Factors  night time, morning stiffness/pain    Pain Relieving Factors rest, OTC medicine, heating pad                [facility] PT Assessment - 03/07/21 0001  Assessment   Medical Diagnosis M25.571 (ICD-10-CM) - Acute right ankle pain  R29.898 (ICD-10-CM) - Weakness of right lower extremity  M25.671 (ICD-10-CM) - Decreased range of motion of right ankle    Referring Provider (PT) Owens Loffler, MD    Onset Date/Surgical Date 09/30/20    Hand Dominance Right      Precautions   Precautions None      Restrictions   Weight Bearing Restrictions No      Balance Screen   Has the patient fallen in the past 6 months Yes    How many times? 1    Has the patient had a decrease in activity level because of a fear of falling?  No    Is the patient reluctant to leave their home because of a fear of falling?  No      Home Ecologist residence    Home Layout Two level    Additional Comments Flight of stairs to enter bedroom      Prior  Function   Vocation Requirements Mail delivery with walking/standing    Leisure Childcare/household      Cognition   Overall Cognitive Status Within Functional Limits for tasks assessed      Observation/Other Assessments   Observations Localized edema Rt ankle    Focus on Therapeutic Outcomes (FOTO)  intake 57%, predicted 77%      Functional Tests   Functional tests Sit to Stand;Single leg stance;Step down      Step Down   Comments Poor control, pain on Rt side WB      Single Leg Stance   Comments Lt 20 seconds, Rt 10 seconds      Sit to Stand   Comments 18 inch chair s UE assist on 1st try      ROM / Strength   AROM / PROM / Strength Strength;PROM;AROM      AROM   Overall AROM Comments Mild complaints medial ankle c end range PF/DF.  Equal inversion and eversion bilateral    AROM Assessment Site Ankle    Right/Left Ankle Left;Right    Right Ankle Dorsiflexion -2    Right Ankle Plantar Flexion 50    Left Ankle Dorsiflexion 9    Left Ankle Plantar Flexion 40      PROM   Overall PROM Comments mild complaints overpressure into DF    PROM Assessment Site Ankle    Right/Left Ankle Right;Left    Right Ankle Dorsiflexion 0    Right Ankle Plantar Flexion 50    Left Ankle Dorsiflexion --    Left Ankle Plantar Flexion --      Strength   Overall Strength Comments Pain in Rt medial ankle noted c MMT all directions    Strength Assessment Site Ankle;Knee;Hip    Right/Left Hip Left;Right    Right/Left Knee Left;Right    Right/Left Ankle Left;Right    Right Ankle Dorsiflexion 4/5    Right Ankle Plantar Flexion 3+/5    Right Ankle Inversion 4/5    Right Ankle Eversion 4/5      Special Tests   Other special tests (-) Anterior drawer, talar tilt      Ambulation/Gait   Gait Comments Reduced toe off progression on Rt ankle/foot                        Objective measurements completed on examination: See above findings.       [facility] Adult PT  Treatment/Exercise - 03/07/21 0001       Exercises   Exercises Other Exercises;Ankle    Other Exercises  HEP instruction/performance c cues for techniques, handout provided.  Trial set performed of each for comprehension and symptom assessment.  HEP consisting of single leg stance, df on step stretch, 4 way ankle band exercises for strengthening      Manual Therapy   Manual therapy comments g3 ap talocrural joint mobs Rt ankle                    PT Education - 03/07/21 1208     Education Details HEP, POC    Person(s) Educated Patient    Methods Explanation;Verbal cues;Handout;Demonstration    Comprehension Returned demonstration;Verbalized understanding              PT Short Term Goals - 03/07/21 1137       PT SHORT TERM GOAL #1   Title Patient will demonstrate independent use of home exercise program to maintain progress from in clinic treatments.    Time 2    Period Weeks    Status New    Target Date 03/21/21               PT Long Term Goals - 03/07/21 1137       PT LONG TERM GOAL #1   Title Patient will demonstrate/report pain at worst less than or equal to 2/10 to facilitate minimal limitation in daily activity secondary to pain symptoms.    Time 6    Period Weeks    Status New    Target Date 04/18/21      PT LONG TERM GOAL #2   Title Patient will demonstrate/report pain at worst less than or equal to 2/10 to facilitate minimal limitation in daily activity secondary to pain symptoms.    Time 6    Period Weeks    Status New    Target Date 04/18/21      PT LONG TERM GOAL #3   Title Pt. will demonstrate FOTO outcome > or = 77% to indicated reduced disability due to condition.    Time 6    Period Weeks    Status New    Target Date 04/18/21      PT LONG TERM GOAL #4   Title Pt. will demonstrate Rt ankle WFL s symptoms to facilitate normalized heel to toe gait at PLOF.    Time 6    Period Weeks    Status New    Target Date 04/18/21       PT LONG TERM GOAL #5   Title Pt. will demonstrate Rt ankle MMT 5/5 throughout to facilitate ability to perform usual daily activity at PLOF s limitation.    Time 6    Period Weeks    Status New    Target Date 04/18/21      Additional Long Term Goals   Additional Long Term Goals Yes      PT LONG TERM GOAL #6   Title Pt. will demonstrate reciprocal gait pattern on stairs s UE assist s deviation for household and work navigation.    Time 6    Period Weeks    Status New    Target Date 04/18/21                    Plan - 03/07/21 1140     Clinical Impression Statement Patient is a 45 y.o. who comes to clinic with  complaints of Rt ankle pain with mobility, strength and movement coordination deficits that impair their ability to perform usual daily and recreational functional activities without increase difficulty/symptoms at this time.  Patient to benefit from skilled PT services to address impairments and limitations to improve to previous level of function without restriction secondary to condition.    Personal Factors and Comorbidities Comorbidity 2    Comorbidities HTN, hyperlipidemia    Examination-Activity Limitations Squat;Stairs;Stand;Locomotion Level    Examination-Participation Restrictions Community Activity;Occupation;Driving;Interpersonal Relationship;Shop;Cleaning    Stability/Clinical Decision Making Stable/Uncomplicated    Clinical Decision Making Low    Rehab Potential Good    PT Frequency 1x / week    PT Duration 6 weeks    PT Treatment/Interventions ADLs/Self Care Home Management;Cryotherapy;Electrical Stimulation;Iontophoresis 31m/ml Dexamethasone;Moist Heat;Balance training;Therapeutic exercise;Therapeutic activities;Functional mobility training;Stair training;Gait training;DME Instruction;Ultrasound;Neuromuscular re-education;Patient/family education;Passive range of motion;Dry needling;Joint Manipulations;Taping;Vasopneumatic Device;Manual techniques    PT  Next Visit Plan DF mobility gains in manual(mobilization c movement), balance improvements.    PT Home Exercise Plan [email or web address]    Consulted and Agree with Plan of Care Patient             Patient will benefit from skilled therapeutic intervention in order to improve the following deficits and impairments:  Abnormal gait, Decreased endurance, Hypomobility, Pain, Decreased strength, Decreased activity tolerance, Decreased balance, Decreased mobility, Difficulty walking, Impaired perceived functional ability, Improper body mechanics, Impaired flexibility, Decreased coordination, Decreased range of motion  Visit Diagnosis: Acute right ankle pain  Muscle weakness (generalized)  Difficulty in walking, not elsewhere classified  Localized edema     Problem List Patient Active Problem List   Diagnosis Date Noted   Right ankle pain 01/24/2021   Constipation 01/24/2021   Routine general medical examination at a health care facility 03/13/2017   Carpal tunnel syndrome 03/13/2017   S/P cesarean section, indication: Active phase arrest  12/04/2016   Postpartum care following cesarean delivery (6/5) 12/04/2016   Chronic hypertension affecting pregnancy 12/04/2016   IUGR (intrauterine growth restriction) affecting care of mother, third trimester, fetus 1 12/04/2016   Encounter for health maintenance examination 04/18/2014   Obesity (BMI 30-39.9) 06/10/2009   POLYCYSTIC OVARIAN DISEASE 03/15/2009   BACK PAIN, LUMBAR, WITH RADICULOPATHY 09/09/2008   Hyperlipidemia 08/03/2008   HYPERTENSION, BENIGN ESSENTIAL 08/03/2008    MScot Jun PT, DPT, OCS, ATC 03/07/21  12:18 PM   PHYSICAL THERAPY DISCHARGE SUMMARY  Visits from Start of Care: 1  Current functional level related to goals / functional outcomes: See note   Remaining deficits: See note   Education / Equipment: HEP   Patient agrees to discharge. Patient goals were not met. Patient is being discharged due to not  returning since the last visit.  MScot Jun PT, DPT, OCS, ATC 06/21/21  11:35 AM      [facility] [address] [address]Phone: [phone]  Fax:  [phone] Name: Leslie RICKETTSMRN: [id number] of Birth: 408/11/1975

## 2021-03-22 ENCOUNTER — Ambulatory Visit: Payer: BC Managed Care – PPO | Admitting: Family Medicine

## 2021-03-27 ENCOUNTER — Encounter: Payer: Self-pay | Admitting: Family Medicine

## 2021-03-27 ENCOUNTER — Ambulatory Visit (INDEPENDENT_AMBULATORY_CARE_PROVIDER_SITE_OTHER): Payer: BC Managed Care – PPO | Admitting: Family Medicine

## 2021-03-27 ENCOUNTER — Other Ambulatory Visit: Payer: Self-pay

## 2021-03-27 VITALS — BP 131/88 | HR 66 | Temp 98.4°F | Ht 64.5 in | Wt 179.4 lb

## 2021-03-27 DIAGNOSIS — Z23 Encounter for immunization: Secondary | ICD-10-CM

## 2021-03-27 DIAGNOSIS — Z111 Encounter for screening for respiratory tuberculosis: Secondary | ICD-10-CM | POA: Diagnosis not present

## 2021-03-27 DIAGNOSIS — Z021 Encounter for pre-employment examination: Secondary | ICD-10-CM | POA: Diagnosis not present

## 2021-03-27 DIAGNOSIS — I10 Essential (primary) hypertension: Secondary | ICD-10-CM | POA: Diagnosis not present

## 2021-03-27 NOTE — Assessment & Plan Note (Signed)
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

## 2021-03-27 NOTE — Patient Instructions (Addendum)
Flu shot today   TB test today   If you find that you have not had hepatitis B immunization we can do that later   Take care of yourself !

## 2021-03-27 NOTE — Progress Notes (Signed)
Subjective:    Patient ID: Leslie Campbell, female    DOB: 08-26-1975, 45 y.o.   MRN: [id number]  This visit occurred during the SARS-CoV-2 public health emergency.  Safety protocols were in place, including screening questions prior to the visit, additional usage of staff PPE, and extensive cleaning of exam room while observing appropriate contact time as indicated for disinfecting solutions.   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   Thinks this is right for her    Flu shot - will get while here  Covid immunized  Unsure if she had hep B imms working in [place] there did testing as well   Tdap 2018  No problems with lift/carry  No vision problems/wears conacts  No hearing problems    HTN bp is stable today  No cp or palpitations or headaches or edema  No side effects to medicines  BP Readings from Last 3 Encounters:  03/27/21 131/88  02/01/21 (!) 150/90  01/24/21 140/80    Takes metoprolol xl 50 mg daily   Lab Results  Component Value Date   CREATININE 0.87 09/14/2020   BUN 15 09/14/2020   NA 139 09/14/2020   K 4.4 09/14/2020   CL 105 09/14/2020   CO2 29 09/14/2020   Hyperlipidemia  Lab Results  Component Value Date   CHOL 137 09/14/2020   HDL 62 09/14/2020   LDLCALC 62 09/14/2020   LDLDIRECT 169.5 08/03/2008   TRIG 51 09/14/2020   CHOLHDL 2.2 09/14/2020   Atorvastatin 10 mg daily   Patient Active Problem List   Diagnosis Date Noted   Screening-pulmonary TB 03/27/2021   Need for history and physical examination for employment 03/27/2021   Right ankle pain 01/24/2021   Constipation  01/24/2021   Routine general medical examination at a health care facility 03/13/2017   Carpal tunnel syndrome 03/13/2017   S/P cesarean section, indication: Active phase arrest  12/04/2016   Postpartum care following cesarean delivery (6/5) 12/04/2016   Chronic hypertension affecting pregnancy 12/04/2016   IUGR (intrauterine growth restriction) affecting care of mother, third trimester, fetus 1 12/04/2016   Encounter for health maintenance examination 04/18/2014   Obesity (BMI 30-39.9) 06/10/2009   POLYCYSTIC OVARIAN DISEASE 03/15/2009   BACK PAIN, LUMBAR, WITH RADICULOPATHY 09/09/2008   Hyperlipidemia 08/03/2008   HYPERTENSION, BENIGN ESSENTIAL 08/03/2008   Past Medical History:  Diagnosis Date   HTN (hypertension)    Hyperlipidemia    no meds   Infertility associated with anovulation    Infertility, female    clomid   Migraine    otc med prn   Miscarriage 01/2010   Miscarriage 2012   x 5 - only 1 required surgery   PCOS (polycystic ovarian syndrome)    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[place])    Past Surgical History:  Procedure Laterality Date   CCY     CERVICAL CERCLAGE N/A 06/22/2016   Procedure: [place] CERCLAGE CERVICAL;  Surgeon: Brien Few, MD;  Location: [facility];  Service: Gynecology;  Laterality: N/A;  EDD: 12/25/16   CESAREAN SECTION N/A 12/04/2016  Procedure: CESAREAN SECTION;  Surgeon: Brien Few, MD;  Location: [facility];  Service: Obstetrics;  Laterality: N/A;   CHOLECYSTECTOMY     DILATION AND CURETTAGE OF UTERUS     MAB   LAPAROSCOPIC LYSIS OF ADHESIONS  01/21/2017   Procedure: LAPAROSCOPIC LYSIS OF ADHESIONS;  Surgeon: Brien Few, MD;  Location: [facility];  Service: Gynecology;;   LAPAROSCOPIC OVARIAN CYSTECTOMY Bilateral    LAPAROSCOPIC TUBAL LIGATION Bilateral 01/21/2017   Procedure: LAPAROSCOPIC TUBAL LIGATION;  Surgeon: Brien Few, MD;  Location: [facility];  Service: Gynecology;  Laterality: Bilateral;   miscarriage  01/2010   WISDOM TOOTH  EXTRACTION     Social History   Tobacco Use   Smoking status: Never   Smokeless tobacco: Never  Vaping Use   Vaping Use: Never used  Substance Use Topics   Alcohol use: No    Alcohol/week: 0.0 standard drinks   Drug use: No   Family History  Problem Relation Age of Onset   Hypertension Mother    Hypertension Father    Diabetes Brother    Diabetes Other    No Known Allergies Current Outpatient Medications on File Prior to Visit  Medication Sig Dispense Refill   albuterol (VENTOLIN HFA) 108 (90 Base) MCG/ACT inhaler INHALE 2 PUFFS INTO THE LUNGS EVERY 4 (FOUR) HOURS AS NEEDED (COUGH, TIGHT CHEST OR WHEEZING). 18 each 3   atorvastatin (LIPITOR) 10 MG tablet Take 1 tablet (10 mg total) by mouth daily. 90 tablet 3   famotidine (PEPCID) 20 MG tablet Take 1 tablet (20 mg total) by mouth 2 (two) times daily. 180 tablet 3   fluticasone (FLONASE) 50 MCG/ACT nasal spray SPRAY 2 SPRAYS INTO EACH NOSTRIL EVERY DAY 48 mL 3   hydrochlorothiazide (HYDRODIURIL) 25 MG tablet Take 1 tablet (25 mg total) by mouth daily. 90 tablet 3   metoprolol succinate (TOPROL-XL) 50 MG 24 hr tablet Take 1 tablet (50 mg total) by mouth daily. Take with or immediately following a meal. 90 tablet 3   potassium chloride (KLOR-CON) 10 MEQ tablet Take 1 tablet (10 mEq total) by mouth daily. 90 tablet 3   No current facility-administered medications on file prior to visit.    Review of Systems  Constitutional:  Negative for activity change, appetite change, fatigue, fever and unexpected weight change.  HENT:  Negative for congestion, ear pain, rhinorrhea, sinus pressure and sore throat.   Eyes:  Negative for pain, redness and visual disturbance.  Respiratory:  Negative for cough, shortness of breath and wheezing.   Cardiovascular:  Negative for chest pain and palpitations.  Gastrointestinal:  Negative for abdominal pain, blood in stool, constipation and diarrhea.  Endocrine: Negative for polydipsia and polyuria.   Genitourinary:  Negative for dysuria, frequency and urgency.  Musculoskeletal:  Negative for arthralgias, back pain and myalgias.  Skin:  Negative for pallor and rash.  Allergic/Immunologic: Negative for environmental allergies.  Neurological:  Negative for dizziness, syncope and headaches.  Hematological:  Negative for adenopathy. Does not bruise/bleed easily.  Psychiatric/Behavioral:  Negative for decreased concentration and dysphoric mood. The patient is not nervous/anxious.       Objective:   Physical Exam Constitutional:      General: She is not in acute distress.    Appearance: Normal appearance. She is obese. She is not ill-appearing or diaphoretic.  HENT:     Head: Normocephalic and atraumatic.     Right Ear: Tympanic membrane, ear canal and external ear normal.     Left Ear:  Tympanic membrane, ear canal and external ear normal.     Mouth/Throat:     Mouth: Mucous membranes are moist.  Eyes:     General:        Right eye: No discharge.        Left eye: No discharge.     Conjunctiva/sclera: Conjunctivae normal.     Pupils: Pupils are equal, round, and reactive to light.  Neck:     Vascular: No carotid bruit.  Cardiovascular:     Rate and Rhythm: Normal rate and regular rhythm.     Heart sounds: Normal heart sounds.  Pulmonary:     Effort: Pulmonary effort is normal. No respiratory distress.     Breath sounds: Normal breath sounds. No wheezing or rales.  Abdominal:     General: Abdomen is flat. Bowel sounds are normal. There is no distension.     Palpations: Abdomen is soft. There is no mass.     Tenderness: There is no abdominal tenderness.  Musculoskeletal:     Cervical back: Normal range of motion and neck supple. No tenderness.     Right lower leg: No edema.     Left lower leg: No edema.     Comments: No acute joint changes  Lymphadenopathy:     Cervical: No cervical adenopathy.  Skin:    Coloration: Skin is not pale.     Findings: No erythema or rash.   Neurological:     Mental Status: She is alert.     Cranial Nerves: No cranial nerve deficit.     Motor: No weakness.     Coordination: Coordination normal.     Gait: Gait normal.     Deep Tendon Reflexes: Reflexes normal.  Psychiatric:        Mood and Affect: Mood normal.          Assessment & Plan:   Problem List Items Addressed This Visit       Cardiovascular and Mediastinum   HYPERTENSION, BENIGN ESSENTIAL    bp in fair control at this time  BP Readings from Last 1 Encounters:  03/27/21 131/88  No changes needed Most recent labs reviewed  Disc lifstyle change with low sodium diet and exercise  Plan to continue metoprolol xl 50 mg daily with hctz 25 mg daily         Other   Screening-pulmonary TB   Relevant Orders   QuantiFERON-TB Gold Plus   Need for history and physical examination for employment - Primary    Pt has no hearing /vision problems or restriction for lifting 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

## 2021-03-27 NOTE — Assessment & Plan Note (Signed)
bp in fair control at this time  BP Readings from Last 1 Encounters:  03/27/21 131/88   No changes needed Most recent labs reviewed  Disc lifstyle change with low sodium diet and exercise  Plan to continue metoprolol xl 50 mg daily with hctz 25 mg daily

## 2021-03-31 LAB — QUANTIFERON-TB GOLD PLUS
Mitogen-NIL: >10 IU/mL
NIL: 0.07 IU/mL
QuantiFERON-TB Gold Plus: NEGATIVE
TB1-NIL: 0.19 IU/mL
TB2-NIL: 0.07 IU/mL

## 2021-05-17 ENCOUNTER — Ambulatory Visit (INDEPENDENT_AMBULATORY_CARE_PROVIDER_SITE_OTHER)
Admission: RE | Admit: 2021-05-17 | Discharge: 2021-05-17 | Disposition: A | Discharge status: Home / Self Care | Payer: BC Managed Care – PPO | Source: Ambulatory Visit | Attending: Family Medicine | Admitting: Family Medicine

## 2021-05-17 ENCOUNTER — Other Ambulatory Visit: Payer: Self-pay

## 2021-05-17 ENCOUNTER — Ambulatory Visit (INDEPENDENT_AMBULATORY_CARE_PROVIDER_SITE_OTHER): Payer: BC Managed Care – PPO | Admitting: Family Medicine

## 2021-05-17 ENCOUNTER — Encounter: Payer: Self-pay | Admitting: Family Medicine

## 2021-05-17 VITALS — BP 140/85 | HR 75 | Temp 98.3°F | Ht 64.5 in | Wt 180.0 lb

## 2021-05-17 DIAGNOSIS — H5789 Other specified disorders of eye and adnexa: Secondary | ICD-10-CM | POA: Diagnosis not present

## 2021-05-17 DIAGNOSIS — R0683 Snoring: Secondary | ICD-10-CM | POA: Diagnosis not present

## 2021-05-17 DIAGNOSIS — M5441 Lumbago with sciatica, right side: Secondary | ICD-10-CM

## 2021-05-17 NOTE — Assessment & Plan Note (Signed)
Suspect irritation  Change contact  Use lubricating drop Do not rub Update if not starting to improve in a week or if worsening
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
## 2021-05-17 NOTE — Assessment & Plan Note (Signed)
Loud snoming with un restorative sleep and witnessed apneic episodes Ref done to pulmonary/sleep specialist Strongly suspect sleep apnea

## 2021-05-17 NOTE — Patient Instructions (Addendum)
Use lubricating eye drops Clean your contact lens Let me know if no improvement    I want to get a back xray today  Try the meloxicam for a day or two  Use heat and ice  Try the stretches   I will refer you to a sleep specialist

## 2021-08-07 ENCOUNTER — Other Ambulatory Visit: Payer: Self-pay

## 2021-08-07 ENCOUNTER — Encounter: Payer: Self-pay | Admitting: Pulmonary Disease

## 2021-08-07 ENCOUNTER — Ambulatory Visit: Payer: BC Managed Care – PPO | Admitting: Pulmonary Disease

## 2021-08-07 VITALS — BP 132/74 | HR 66 | Temp 98.2°F | Ht 64.0 in | Wt 179.0 lb

## 2021-08-07 DIAGNOSIS — R0683 Snoring: Secondary | ICD-10-CM | POA: Diagnosis not present

## 2021-08-07 NOTE — Progress Notes (Signed)
[facility]  Chief Complaint  Patient presents with   Consult    Sleep consult. Pt states that she has had this going on for 4-5 years now. Pt states she has never had a sleep study done before. Pt reports snoring, witnessed apneas and daytime downiness, mild morning headaches. Pt states she doesn't feel rested.     Past Surgical History:  She  has a past surgical history that includes CCY; miscarriage (01/2010); Cholecystectomy; Laparoscopic ovarian cystectomy (Bilateral); Wisdom tooth extraction; Cervical cerclage (N/A, 06/22/2016); Cesarean section (N/A, 12/04/2016); Dilation and curettage of uterus; Laparoscopic tubal ligation (Bilateral, 01/21/2017); and Laparoscopic lysis of adhesions (01/21/2017).  Past Medical History:  HTN, HLD, Migraine headaches, Polycystic ovarian syndrome, Pituitary hyperfunction  Constitutional:  BP 132/74 (BP Location: Left Arm, Patient Position: Sitting, Cuff Size: Normal)    Pulse 66    Temp 98.2 F (36.8 C) (Oral)    Ht 5' 4"  (1.626 m)    Wt 179 lb (81.2 kg)    SpO2 98%    BMI 30.73 kg/m   Brief Summary:  Leslie Campbell is a 46 y.o. female with snoring.      Subjective:   She was seen by her PCP recently.  She had concern about snoring and episodes of apnea while asleep.  Feels more tired during the day.  She wakes up hearing herself snore.  She will talk in her sleep.  She feels tired in the morning.  She has trouble staying awake when watching TV.  She got a new job about 2 years ago and has to do a lot of walking.  She lost about 50 lbs, but still has trouble with her sleep.  Her father has sleep apnea and uses a CPAP machine.  She goes to sleep at 9 pm.  She falls asleep in 20 minutes.  She wakes up 2 times to use the bathroom.  She gets out of bed at 6 am.  She feels tired in the morning.  She gets headaches intermittently.  She uses melatonin a few times per month to help fall asleep.  She is not using  anything to help stay awake.  She denies sleep walking, sleep talking, bruxism, or nightmares.  There is no history of restless legs.  She denies sleep hallucinations, sleep paralysis, or cataplexy.  The Epworth score is 5 out of 24.   Physical Exam:   Appearance - well kempt   ENMT - no sinus tenderness, no oral exudate, no LAN, Mallampati 4 airway, no stridor  Respiratory - equal breath sounds bilaterally, no wheezing or rales  CV - s1s2 regular rate and rhythm, no murmurs  Ext - no clubbing, no edema  Skin - no rashes  Psych - normal mood and affect   Sleep Tests:    Social History:  She  reports that she has never smoked. She has never used smokeless tobacco. She reports that she does not drink alcohol and does not use drugs.  Family History:  Her family history includes Diabetes in her brother and another family member; Hypertension in her father and mother.    Discussion:  She has snoring, sleep disruption, apnea, and daytime sleepiness.  She has history of hypertension.  I am concerned she could have obstructive sleep apnea.  Assessment/Plan:   Snoring with excessive daytime sleepiness. - will need to arrange for a home sleep study  Obesity. - discussed how weight can impact sleep and risk for  sleep disordered breathing - discussed options to assist with weight loss: combination of diet modification, cardiovascular and strength training exercises  Cardiovascular risk. - had an extensive discussion regarding the adverse health consequences related to untreated sleep disordered breathing - specifically discussed the risks for hypertension, coronary artery disease, cardiac dysrhythmias, cerebrovascular disease, and diabetes - lifestyle modification discussed  Safe driving practices. - discussed how sleep disruption can increase risk of accidents, particularly when driving - safe driving practices were discussed  Therapies for obstructive sleep apnea. - if  the sleep study shows significant sleep apnea, then various therapies for treatment were reviewed: CPAP, oral appliance, and surgical interventions   Time Spent Involved in Patient Care on Day of Examination:  36 minutes  Follow up:   Patient Instructions  Will arrange for home sleep study Will call to arrange for follow up after sleep study reviewed   Medication List:   Allergies as of 08/07/2021   No Known Allergies      Medication List        Accurate as of August 07, 2021  3:48 PM. If you have any questions, ask your nurse or doctor.          STOP taking these medications    meloxicam 7.5 MG tablet Commonly known as: MOBIC Stopped by: Chesley Mires, MD       TAKE these medications    albuterol 108 (90 Base) MCG/ACT inhaler Commonly known as: VENTOLIN HFA INHALE 2 PUFFS INTO THE LUNGS EVERY 4 (FOUR) HOURS AS NEEDED (COUGH, TIGHT CHEST OR WHEEZING).   atorvastatin 10 MG tablet Commonly known as: LIPITOR Take 1 tablet (10 mg total) by mouth daily.   cyclobenzaprine 10 MG tablet Commonly known as: FLEXERIL Take 10 mg by mouth 2 (two) times daily as needed.   famotidine 20 MG tablet Commonly known as: PEPCID Take 1 tablet (20 mg total) by mouth 2 (two) times daily.   fluticasone 50 MCG/ACT nasal spray Commonly known as: FLONASE SPRAY 2 SPRAYS INTO EACH NOSTRIL EVERY DAY What changed: additional instructions   hydrochlorothiazide 25 MG tablet Commonly known as: HYDRODIURIL Take 1 tablet (25 mg total) by mouth daily.   metoprolol succinate 50 MG 24 hr tablet Commonly known as: TOPROL-XL Take 1 tablet (50 mg total) by mouth daily. Take with or immediately following a meal.   potassium chloride 10 MEQ tablet Commonly known as: KLOR-CON Take 1 tablet (10 mEq total) by mouth daily.        Signature:  Chesley Mires, MD [place] Pager - [phone] 08/07/2021, 3:48 PM

## 2021-08-07 NOTE — Patient Instructions (Signed)
Will arrange for home sleep study Will call to arrange for follow up after sleep study reviewed  

## 2021-08-22 LAB — HM PAP SMEAR

## 2021-08-22 LAB — HM MAMMOGRAPHY

## 2021-08-31 ENCOUNTER — Encounter: Payer: Self-pay | Admitting: Family Medicine

## 2021-10-19 ENCOUNTER — Ambulatory Visit: Payer: BC Managed Care – PPO

## 2021-10-19 DIAGNOSIS — G4733 Obstructive sleep apnea (adult) (pediatric): Secondary | ICD-10-CM

## 2021-10-19 DIAGNOSIS — R0683 Snoring: Secondary | ICD-10-CM

## 2021-10-23 ENCOUNTER — Telehealth: Payer: Self-pay | Admitting: Pulmonary Disease

## 2021-10-23 DIAGNOSIS — G4733 Obstructive sleep apnea (adult) (pediatric): Secondary | ICD-10-CM | POA: Diagnosis not present

## 2021-10-23 NOTE — Telephone Encounter (Signed)
HST 10/19/20 >> AHI 8.5, SpO2 low 82% ? ?Please inform her that her sleep study shows mild obstructive sleep apnea.  Please arrange for ROV with me or NP to discuss treatment options. ? ? ? ?

## 2021-10-23 NOTE — Telephone Encounter (Signed)
Called and went over results with patient. She voiced understanding. Scheduled an appt for next Monday in [place] office with NP. Nothing further needed.  ?

## 2021-10-30 ENCOUNTER — Ambulatory Visit: Payer: BC Managed Care – PPO | Admitting: Nurse Practitioner

## 2021-10-30 ENCOUNTER — Encounter: Payer: Self-pay | Admitting: Nurse Practitioner

## 2021-10-30 VITALS — BP 140/82 | HR 77 | Temp 98.2°F | Ht 64.0 in | Wt 185.2 lb

## 2021-10-30 DIAGNOSIS — R0683 Snoring: Secondary | ICD-10-CM

## 2021-10-30 DIAGNOSIS — G4733 Obstructive sleep apnea (adult) (pediatric): Secondary | ICD-10-CM

## 2021-10-30 DIAGNOSIS — E669 Obesity, unspecified: Secondary | ICD-10-CM | POA: Diagnosis not present

## 2021-10-30 NOTE — Progress Notes (Signed)
? ?@Patient  ID: Leslie Campbell, female    DOB: Oct 29, 1975, 46 y.o.   MRN: [id number] ? ?Chief Complaint  ?Patient presents with  ? Follow-up  ?  Here today to go over sleep study treatment options.   ? ? ?Referring provider: ?Tower, Wynelle Fanny, MD ? ?HPI: ?46 year old female, never smoker followed for mild obstructive sleep apnea.  She is a patient of Dr. Juanetta Gosling and last seen in office 08/07/2021 for sleep consult.  Past medical history significant for hypertension, HLD, migraine headaches, PCOS, pituitary hyperfunction. ? ?TEST/EVENTS:  ?10/19/2020 HST: AHI 8.5, SPO2 low 82% ? ?08/07/2021: OV with Dr Halford Chessman.  Recently seen by PCP who referred her due to reports of snoring and episodes of apnea while sleeping.  She also had excessive daytime fatigue symptoms.  Wakes feeling tired in the morning.  Has trouble staying awake when watching TV and concentrating during the day.  Reported that she started a job about 2 years ago and has lost about 50 pounds since.  Gets around 8 to 9 hours sleep at night, wakes to use the restroom.  Uses melatonin occasionally to help her fall asleep.  HST ordered for further evaluation given concern for OSA. ? ?10/30/2021: Today-follow-up ?Patient presents today for follow-up to discuss home sleep study results which showed mild obstructive sleep apnea.  She continues to have daytime fatigue symptoms and snoring.  Denies any morning headaches or drowsy driving.  She does have occasional intermittent headaches throughout the day.  Has trouble staying asleep when watching TV.  Continues to sleep around 8 to 9 hours a night.  Rarely requires something to help her fall asleep but will use melatonin on occasion. ? ?No Known Allergies ? ?Immunization History  ?Administered Date(s) Administered  ? Influenza Split 05/16/2011  ? Influenza,inj,Quad PF,6+ Mos 04/26/2014, 02/28/2016, 03/13/2017, 03/27/2021  ? PFIZER(Purple Top)SARS-COV-2 Vaccination 02/11/2020, 03/03/2020  ? Td 04/02/2003  ? Tdap 04/26/2014,  10/04/2016  ? ? ?Past Medical History:  ?Diagnosis Date  ? HTN (hypertension)   ? Hyperlipidemia   ? no meds  ? Infertility associated with anovulation   ? Infertility, female   ? clomid  ? Migraine   ? otc med prn  ? Miscarriage 01/2010  ? Miscarriage 2012  ? x 5 - only 1 required surgery  ? PCOS (polycystic ovarian syndrome)   ? 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[place])   ? ? ?Tobacco History: ?Social History  ? ?Tobacco Use  ?Smoking Status Never  ?Smokeless Tobacco Never  ? ?Counseling given: Not Answered ? ? ?Outpatient Medications Prior to Visit  ?Medication Sig Dispense Refill  ? albuterol (VENTOLIN HFA) 108 (90 Base) MCG/ACT inhaler INHALE 2 PUFFS INTO THE LUNGS EVERY 4 (FOUR) HOURS AS NEEDED (COUGH, TIGHT CHEST OR WHEEZING). 18 each 3  ? atorvastatin (LIPITOR) 10 MG tablet Take 1 tablet (10 mg total) by mouth daily. 90 tablet 3  ? cyclobenzaprine (FLEXERIL) 10 MG tablet Take 10 mg by mouth 2 (two) times daily as needed.    ? famotidine (PEPCID) 20 MG tablet Take 1 tablet (20 mg total) by mouth 2 (two) times daily. 180 tablet 3  ? fluticasone (FLONASE) 50 MCG/ACT nasal spray SPRAY 2 SPRAYS INTO EACH NOSTRIL EVERY DAY (Patient taking differently: SPRAY 2 SPRAYS INTO EACH NOSTRIL EVERY DAY AS NEEDED) 48 mL 3  ? hydrochlorothiazide (HYDRODIURIL) 25 MG tablet Take 1 tablet (25 mg total) by mouth daily. 90 tablet 3  ? metoprolol succinate (TOPROL-XL) 50 MG 24 hr tablet Take 1 tablet (50  mg total) by mouth daily. Take with or immediately following a meal. 90 tablet 3  ? potassium chloride (KLOR-CON) 10 MEQ tablet Take 1 tablet (10 mEq total) by mouth daily. 90 tablet 3  ? ?No facility-administered medications prior to visit.  ? ? ? ?Review of Systems:  ? ?Constitutional: No weight loss or gain, night sweats, fevers, chills + fatigue ?HEENT: No headaches, difficulty swallowing, tooth/dental problems, or sore throat. No sneezing, itching, ear ache, nasal congestion, or post nasal drip. + Snoring ?CV:  No chest pain, orthopnea,  PND, swelling in lower extremities, anasarca, dizziness, palpitations, syncope ?Resp: No shortness of breath with exertion or at rest. No excess mucus or change in color of mucus. No productive or non-productive. No hemoptysis. No wheezing.  No chest wall deformity ?Skin: No rash, lesions, ulcerations ?MSK:  No joint pain or swelling.  No decreased range of motion.  No back pain. ?Neuro: No dizziness or lightheadedness.  ?Psych: No depression or anxiety. Mood stable.  ? ? ? ?Physical Exam: ? ?BP 140/82 (BP Location: Left Arm, Patient Position: Sitting, Cuff Size: Normal) Comment: pt has hx of HTN. KC is aware  Pulse 77   Temp 98.2 ?F (36.8 ?C) (Oral)   Ht 5' 4"  (1.626 m)   Wt 185 lb 3.2 oz (84 kg)   SpO2 99%   BMI 31.79 kg/m?  ? ?GEN: Pleasant, interactive, well-appearing; obese; in no acute distress. ?HEENT:  Normocephalic and atraumatic.  PERRLA. Sclera white. Nasal turbinates pink, moist and patent bilaterally. No rhinorrhea present. Oropharynx pink and moist, without exudate or edema. No lesions, ulcerations, or postnasal drip.  ?NECK:  Supple w/ fair ROM. No JVD present. Normal carotid impulses w/o bruits. Thyroid symmetrical with no goiter or nodules palpated. No lymphadenopathy.   ?CV: RRR, no m/r/g, no peripheral edema. Pulses intact, +2 bilaterally. No cyanosis, pallor or clubbing. ?PULMONARY:  Unlabored, regular breathing. Clear bilaterally A&P w/o wheezes/rales/rhonchi. No accessory muscle use. No dullness to percussion. ?GI: BS present and normoactive. Soft, non-tender to palpation. ?MSK: No erythema, warmth or tenderness. Cap refil <2 sec all extrem. No deformities or joint swelling noted.  ?Neuro: A/Ox3. No focal deficits noted.   ?Skin: Warm, no lesions or rashe ?Psych: Normal affect and behavior. Judgement and thought content appropriate.  ? ? ? ?Lab Results: ? ?CBC ?   ?Component Value Date/Time  ? WBC 3.1 (L) 09/14/2020 0831  ? RBC 4.43 09/14/2020 0831  ? HGB 14.2 09/14/2020 0831  ? HGB 13.5  03/13/2017 1700  ? HCT 42.4 09/14/2020 0831  ? HCT 39.7 03/13/2017 1700  ? PLT 235 09/14/2020 0831  ? PLT 300 03/13/2017 1700  ? MCV 95.7 09/14/2020 0831  ? MCV 90 03/13/2017 1700  ? MCH 32.1 09/14/2020 0831  ? MCHC 33.5 09/14/2020 0831  ? RDW 12.4 09/14/2020 0831  ? RDW 14.7 03/13/2017 1700  ? LYMPHSABS 1,243 09/14/2020 0831  ? LYMPHSABS 1.8 03/13/2017 1700  ? MONOABS 0.4 04/19/2014 0813  ? EOSABS 102 09/14/2020 0831  ? EOSABS 0.2 03/13/2017 1700  ? BASOSABS 31 09/14/2020 0831  ? BASOSABS 0.0 03/13/2017 1700  ? ? ?BMET ?   ?Component Value Date/Time  ? NA 139 09/14/2020 0831  ? NA 141 03/13/2017 1700  ? K 4.4 09/14/2020 0831  ? CL 105 09/14/2020 0831  ? CO2 29 09/14/2020 0831  ? GLUCOSE 80 09/14/2020 0831  ? BUN 15 09/14/2020 0831  ? BUN 12 03/13/2017 1700  ? CREATININE 0.87 09/14/2020 0831  ? CALCIUM  9.2 09/14/2020 0831  ? GFRNONAA 66 03/13/2017 1700  ? GFRAA 76 03/13/2017 1700  ? ? ?BNP ?No results found for: BNP ? ? ?Imaging: ? ?No results found. ? ? ? ?   ? View : No data to display.  ?  ?  ?  ? ? ?No results found for: NITRICOXIDE ? ? ? ? ? ?Assessment & Plan:  ? ?Mild obstructive sleep apnea ?Mild obstructive sleep apnea with AHI 8.5.  We discussed different options for treatment of mild OSA.  Patient would like to move forward with trial of CPAP therapy.  Order sent to DME for new start. ? ?Patient Instructions  ?Start CPAP auto 5-15 cmH2O every night, minimum of 4-6 hours a night.  ?Change equipment every 30 days or as directed by DME. Wash your tubing with warm soap and water daily, hang to dry. Wash humidifier portion weekly.  ?Be aware of reduced alertness and do not drive or operate heavy machinery if experiencing this or drowsiness.  ?Healthy weight management discussed.  ?Avoid or decrease alcohol consumption and medications that make you more sleepy, if possible. ?Notify if persistent daytime sleepiness occurs even with consistent use of CPAP. ? ?We discussed how untreated sleep apnea puts an  individual at risk for cardiac arrhthymias, pulm HTN, DM, stroke and increases their risk for daytime accidents; although, these are less with mild obstructive sleep apnea. We also briefly reviewed treatment options i

## 2021-10-30 NOTE — Patient Instructions (Addendum)
Start CPAP auto 5-15 cmH2O every night, minimum of 4-6 hours a night.  ?Change equipment every 30 days or as directed by DME. Wash your tubing with warm soap and water daily, hang to dry. Wash humidifier portion weekly.  ?Be aware of reduced alertness and do not drive or operate heavy machinery if experiencing this or drowsiness.  ?Healthy weight management discussed.  ?Avoid or decrease alcohol consumption and medications that make you more sleepy, if possible. ?Notify if persistent daytime sleepiness occurs even with consistent use of CPAP. ? ?We discussed how untreated sleep apnea puts an individual at risk for cardiac arrhthymias, pulm HTN, DM, stroke and increases their risk for daytime accidents; although, these are less with mild obstructive sleep apnea. We also briefly reviewed treatment options including weight loss, side sleeping position, oral appliance, CPAP therapy or referral to ENT for possible surgical options ? ?Follow up in 31-90 days with Leslie Campbell or Leslie Campbell. If symptoms do not improve or worsen, please contact office for sooner follow up or seek emergency care. ?

## 2021-10-30 NOTE — Assessment & Plan Note (Signed)
Healthy weight management discussed at visit.  Walks frequently at work.  Has lost around 50 pounds over the last 2 years. ?

## 2021-10-30 NOTE — Assessment & Plan Note (Addendum)
Mild obstructive sleep apnea with AHI 8.5.  We discussed different options for treatment of mild OSA.  Patient would like to move forward with trial of CPAP therapy.  Order sent to DME for new start. ? ?Patient Instructions  ?Start CPAP auto 5-15 cmH2O every night, minimum of 4-6 hours a night.  ?Change equipment every 30 days or as directed by DME. Wash your tubing with warm soap and water daily, hang to dry. Wash humidifier portion weekly.  ?Be aware of reduced alertness and do not drive or operate heavy machinery if experiencing this or drowsiness.  ?Healthy weight management discussed.  ?Avoid or decrease alcohol consumption and medications that make you more sleepy, if possible. ?Notify if persistent daytime sleepiness occurs even with consistent use of CPAP. ? ?We discussed how untreated sleep apnea puts an individual at risk for cardiac arrhthymias, pulm HTN, DM, stroke and increases their risk for daytime accidents; although, these are less with mild obstructive sleep apnea. We also briefly reviewed treatment options including weight loss, side sleeping position, oral appliance, CPAP therapy or referral to ENT for possible surgical options ? ?Follow up in 31-90 days with Dr. Halford Chessman or Alanson Aly. If symptoms do not improve or worsen, please contact office for sooner follow up or seek emergency care. ? ? ?

## 2021-10-31 NOTE — Progress Notes (Signed)
Reviewed and agree with assessment/plan. ? ? ?Chesley Mires, MD ?[place] ?10/31/2021, 8:21 AM ?Pager:  [phone] ? ?

## 2021-11-04 ENCOUNTER — Other Ambulatory Visit: Payer: Self-pay | Admitting: Family Medicine

## 2021-11-05 ENCOUNTER — Other Ambulatory Visit: Payer: Self-pay | Admitting: Family Medicine

## 2021-11-14 ENCOUNTER — Encounter: Payer: Self-pay | Admitting: Family Medicine

## 2021-11-14 ENCOUNTER — Ambulatory Visit (INDEPENDENT_AMBULATORY_CARE_PROVIDER_SITE_OTHER): Payer: BC Managed Care – PPO | Admitting: Family Medicine

## 2021-11-14 VITALS — BP 140/82 | HR 71 | Ht 64.0 in | Wt 182.0 lb

## 2021-11-14 DIAGNOSIS — G4733 Obstructive sleep apnea (adult) (pediatric): Secondary | ICD-10-CM

## 2021-11-14 DIAGNOSIS — E669 Obesity, unspecified: Secondary | ICD-10-CM

## 2021-11-14 DIAGNOSIS — E7800 Pure hypercholesterolemia, unspecified: Secondary | ICD-10-CM

## 2021-11-14 DIAGNOSIS — Z0000 Encounter for general adult medical examination without abnormal findings: Secondary | ICD-10-CM

## 2021-11-14 DIAGNOSIS — I10 Essential (primary) hypertension: Secondary | ICD-10-CM

## 2021-11-14 DIAGNOSIS — E282 Polycystic ovarian syndrome: Secondary | ICD-10-CM | POA: Diagnosis not present

## 2021-11-14 DIAGNOSIS — Z1211 Encounter for screening for malignant neoplasm of colon: Secondary | ICD-10-CM | POA: Insufficient documentation

## 2021-11-14 NOTE — Assessment & Plan Note (Signed)
bp in fair control at this time  ?BP Readings from Last 1 Encounters:  ?11/14/21 140/82  ? ?No changes needed ?Most recent labs reviewed  ?Disc lifstyle change with low sodium diet and exercise  ?Plan to continue ?Metoprolol xl 50 mg daily  ?hctz 25 mg daily  ?K  ?Labs pending  ?

## 2021-11-14 NOTE — Progress Notes (Signed)
? ?Subjective:  ? ? Patient ID: Leslie Campbell, female    DOB: 31-May-1976, 45 y.o.   MRN: [id number] ? ?HPI ?Here for health maintenance exam and to review chronic medical problems   ? ?Wt Readings from Last 3 Encounters:  ?11/14/21 182 lb (82.6 kg)  ?10/30/21 185 lb 3.2 oz (84 kg)  ?08/07/21 179 lb (81.2 kg)  ? ?31.24 kg/m? ? ?Doing fine  ?Working  ? ?Has a kid grad HS and one starting K  ? ?Immunization History  ?Administered Date(s) Administered  ? Influenza Split 05/16/2011  ? Influenza,inj,Quad PF,6+ Mos 04/26/2014, 02/28/2016, 03/13/2017, 03/27/2021  ? PFIZER(Purple Top)SARS-COV-2 Vaccination 02/11/2020, 03/03/2020  ? Td 04/02/2003  ? Tdap 04/26/2014, 10/04/2016  ? ? ?Colon cancer screening :  ?None in family  ?Not ready for colonoscopy  ?Will try for cologuard  ? ?Father died recently : copd and HF  ?Some grief  ?Taking care of her mother / may move in with her  ? ? ?Pap 08/22/2021  at gyn    ?Dr Edwyna Ready  ?History of pcos  ?BTL in past  ? ?Mammogram 08/22/21  ?Self breast exam  -no lumps  ? ?Was diagnosed with sleep apnea  ?Getting cpap soon  ? ?HTN ?bp is stable today  ?No cp or palpitations or headaches or edema  ?No side effects to medicines  ?BP Readings from Last 3 Encounters:  ?11/14/21 140/82  ?10/30/21 140/82  ?08/07/21 132/74  ? ? ? ?Pulse Readings from Last 3 Encounters:  ?11/14/21 71  ?10/30/21 77  ?08/07/21 66  ? ? ?Metoprolol xl 50 mg daily  ?Hctz 25 mg daily  ?Forgets K sometimes  ? ? ?Lab Results  ?Component Value Date  ? CREATININE 0.87 09/14/2020  ? BUN 15 09/14/2020  ? NA 139 09/14/2020  ? K 4.4 09/14/2020  ? CL 105 09/14/2020  ? CO2 29 09/14/2020  ? ?Lab Results  ?Component Value Date  ? HGBA1C 5.1 09/14/2020  ? ? ?Hyperlipidemia ?Lab Results  ?Component Value Date  ? CHOL 137 09/14/2020  ? HDL 62 09/14/2020  ? [place] 62 09/14/2020  ? LDLDIRECT 169.5 08/03/2008  ? TRIG 51 09/14/2020  ? CHOLHDL 2.2 09/14/2020  ? ?Atorvastatin 10 mg daily  ? ?Patient Active Problem List  ? Diagnosis Date Noted   ? Colon cancer screening 11/14/2021  ? Mild obstructive sleep apnea 10/30/2021  ? Snoring 05/17/2021  ? Screening-pulmonary TB 03/27/2021  ? Routine general medical examination at a health care facility 03/13/2017  ? Carpal tunnel syndrome 03/13/2017  ? Chronic hypertension affecting pregnancy 12/04/2016  ? IUGR (intrauterine growth restriction) affecting care of mother, third trimester, fetus 1 12/04/2016  ? Encounter for health maintenance examination 04/18/2014  ? Obesity (BMI 30-39.9) 06/10/2009  ? POLYCYSTIC OVARIAN DISEASE 03/15/2009  ? Hyperlipidemia 08/03/2008  ? HYPERTENSION, BENIGN ESSENTIAL 08/03/2008  ? ?Past Medical History:  ?Diagnosis Date  ? HTN (hypertension)   ? Hyperlipidemia   ? no meds  ? Infertility associated with anovulation   ? Infertility, female   ? clomid  ? Migraine   ? otc med prn  ? Miscarriage 01/2010  ? Miscarriage 2012  ? x 5 - only 1 required surgery  ? PCOS (polycystic ovarian syndrome)   ? 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 ?Past Surgical History:  ?Procedure Laterality Date  ? CCY    ? CERVICAL CERCLAGE N/A 06/22/2016  ? Procedure: [place] CERCLAGE CERVICAL;  Surgeon: Brien Few, MD;  Location: [facility];  Service: Gynecology;  Laterality: N/A;  EDD: 12/25/16  ? CESAREAN SECTION N/A 12/04/2016  ? Procedure: CESAREAN SECTION;  Surgeon: Brien Few, MD;  Location: [facility];  Service: Obstetrics;  Laterality: N/A;  ? CHOLECYSTECTOMY    ? DILATION AND CURETTAGE OF UTERUS    ? MAB  ? LAPAROSCOPIC LYSIS OF ADHESIONS  01/21/2017  ? Procedure: LAPAROSCOPIC LYSIS OF ADHESIONS;  Surgeon: Brien Few, MD;  Location: [facility];  Service: Gynecology;;  ? LAPAROSCOPIC OVARIAN CYSTECTOMY Bilateral   ? LAPAROSCOPIC TUBAL LIGATION Bilateral 01/21/2017  ? Procedure: LAPAROSCOPIC TUBAL LIGATION;  Surgeon: Brien Few, MD;  Location: [facility];  Service: Gynecology;  Laterality: Bilateral;  ? miscarriage  01/2010  ? WISDOM TOOTH EXTRACTION    ? ?Social History  ? ?Tobacco Use  ? Smoking  status: Never  ? Smokeless tobacco: Never  ?Vaping Use  ? Vaping Use: Never used  ?Substance Use Topics  ? Alcohol use: No  ?  Alcohol/week: 0.0 standard drinks  ? Drug use: No  ? ?Family History  ?Problem Relation Age of Onset  ? Hypertension Mother   ? Hypertension Father   ? Diabetes Brother   ? Diabetes Other   ? ?No Known Allergies ?Current Outpatient Medications on File Prior to Visit  ?Medication Sig Dispense Refill  ? albuterol (VENTOLIN HFA) 108 (90 Base) MCG/ACT inhaler INHALE 2 PUFFS INTO THE LUNGS EVERY 4 (FOUR) HOURS AS NEEDED (COUGH, TIGHT CHEST OR WHEEZING). 18 each 3  ? atorvastatin (LIPITOR) 10 MG tablet TAKE 1 TABLET BY MOUTH EVERY DAY 90 tablet 0  ? cyclobenzaprine (FLEXERIL) 10 MG tablet Take 10 mg by mouth 2 (two) times daily as needed.    ? famotidine (PEPCID) 20 MG tablet TAKE 1 TABLET BY MOUTH TWICE A DAY 180 tablet 0  ? fluticasone (FLONASE) 50 MCG/ACT nasal spray SPRAY 2 SPRAYS INTO EACH NOSTRIL EVERY DAY AS NEEDED 48 mL 0  ? hydrochlorothiazide (HYDRODIURIL) 25 MG tablet TAKE 1 TABLET (25 MG TOTAL) BY MOUTH DAILY. 90 tablet 0  ? metoprolol succinate (TOPROL-XL) 50 MG 24 hr tablet TAKE 1 TABLET BY MOUTH DAILY. TAKE WITH OR IMMEDIATELY FOLLOWING A MEAL. 90 tablet 0  ? potassium chloride (KLOR-CON) 10 MEQ tablet TAKE 1 TABLET BY MOUTH EVERY DAY 90 tablet 0  ? ?No current facility-administered medications on file prior to visit.  ?  ? ?Review of Systems  ?Constitutional:  Positive for fatigue. Negative for activity change, appetite change, fever and unexpected weight change.  ?HENT:  Negative for congestion, ear pain, rhinorrhea, sinus pressure and sore throat.   ?Eyes:  Negative for pain, redness and visual disturbance.  ?Respiratory:  Positive for apnea. Negative for cough, shortness of breath and wheezing.   ?     Sleep apnea   ?Cardiovascular:  Negative for chest pain and palpitations.  ?Gastrointestinal:  Negative for abdominal pain, blood in stool, constipation and diarrhea.  ?Endocrine:  Negative for polydipsia and polyuria.  ?Genitourinary:  Negative for dysuria, frequency and urgency.  ?Musculoskeletal:  Negative for arthralgias, back pain and myalgias.  ?Skin:  Negative for pallor and rash.  ?Allergic/Immunologic: Negative for environmental allergies.  ?Neurological:  Negative for dizziness, syncope and headaches.  ?Hematological:  Negative for adenopathy. Does not bruise/bleed easily.  ?Psychiatric/Behavioral:  Negative for decreased concentration and dysphoric mood. The patient is not nervous/anxious.   ? ?   ?Objective:  ? Physical Exam ?Constitutional:   ?   General: She is not in acute distress. ?   Appearance: Normal appearance.  She is well-developed. She is obese. She is not ill-appearing or diaphoretic.  ?HENT:  ?   Head: Normocephalic and atraumatic.  ?   Right Ear: Tympanic membrane, ear canal and external ear normal.  ?   Left Ear: Tympanic membrane, ear canal and external ear normal.  ?   Nose: Nose normal. No congestion.  ?   Mouth/Throat:  ?   Mouth: Mucous membranes are moist.  ?   Pharynx: Oropharynx is clear. No posterior oropharyngeal erythema.  ?Eyes:  ?   General: No scleral icterus. ?   Extraocular Movements: Extraocular movements intact.  ?   Conjunctiva/sclera: Conjunctivae normal.  ?   Pupils: Pupils are equal, round, and reactive to light.  ?Neck:  ?   Thyroid: No thyromegaly.  ?   Vascular: No carotid bruit or JVD.  ?Cardiovascular:  ?   Rate and Rhythm: Normal rate and regular rhythm.  ?   Pulses: Normal pulses.  ?   Heart sounds: Normal heart sounds.  ?  No gallop.  ?Pulmonary:  ?   Effort: Pulmonary effort is normal. No respiratory distress.  ?   Breath sounds: Normal breath sounds. No wheezing.  ?   Comments: Good air exch ?Chest:  ?   Chest wall: No tenderness.  ?Abdominal:  ?   General: Bowel sounds are normal. There is no distension or abdominal bruit.  ?   Palpations: Abdomen is soft. There is no mass.  ?   Tenderness: There is no abdominal tenderness.  ?    Hernia: No hernia is present.  ?Genitourinary: ?   Comments: Breast and pelvic exam done by gyn in 08/2021 ?   ?Musculoskeletal:     ?   General: No tenderness. Normal range of motion.  ?   Cervical back:

## 2021-11-14 NOTE — Assessment & Plan Note (Signed)
Reviewed health habits including diet and exercise and skin cancer prevention ?Reviewed appropriate screening tests for age  ?Also reviewed health mt list, fam hx and immunization status , as well as social and family history   ?See HPI ?Labs ordered  ?Pap and mammogram utd with gyn  ?Ordered cologuard for colon cancer screen  ?Starting cpap soon for oSa -should improve general health  ? ? ?

## 2021-11-14 NOTE — Assessment & Plan Note (Signed)
D/w patient [id number] for colon cancer screening, including IFOB vs cologuard vs. colonoscopy.  Risks and benefits of both were discussed and patient voiced understanding.  Pt elects for: cologuard test  ? ?This was ordered ?She will check on coverage ? ?

## 2021-11-14 NOTE — Assessment & Plan Note (Signed)
Disc goals for lipids and reasons to control them ?Rev last labs with pt ?Rev low sat fat diet in detail ?Labs ordered  ? ?Atorvastatin 10 mg daily  ?Per pt diet is stable ?

## 2021-11-14 NOTE — Patient Instructions (Addendum)
Let's order cologuard for screening  ?If you find out that insurance does not pay please let us know  ? ?Take care of yourself  ?Stay active ?Use sun protection  ? ?Eat a healthy balanced diet  ? ?Labs today  ? ? ? ?

## 2021-11-14 NOTE — Assessment & Plan Note (Signed)
Planning to start cpap soon ?Expect this will help bp and energy level  ?

## 2021-11-14 NOTE — Assessment & Plan Note (Signed)
Discussed how this problem influences overall health and the risks it imposes  Reviewed plan for weight loss with lower calorie diet (via better food choices and also portion control or program like weight watchers) and exercise building up to or more than 30 minutes 5 days per week including some aerobic activity    

## 2021-11-15 LAB — TSH: TSH: 0.74 mIU/L

## 2021-11-15 LAB — CBC WITH DIFFERENTIAL/PLATELET
Absolute Monocytes: 257 cells/uL (ref 200–950)
Basophils Absolute: 20 cells/uL (ref 0–200)
Basophils Relative: 0.6 %
Eosinophils Absolute: 69 cells/uL (ref 15–500)
Eosinophils Relative: 2.1 %
HCT: 42.3 % (ref 35.0–45.0)
Hemoglobin: 14.2 g/dL (ref 11.7–15.5)
Lymphs Abs: 1485 cells/uL (ref 850–3900)
MCH: 32 pg (ref 27.0–33.0)
MCHC: 33.6 g/dL (ref 32.0–36.0)
MCV: 95.3 fL (ref 80.0–100.0)
MPV: 12 fL (ref 7.5–12.5)
Monocytes Relative: 7.8 %
Neutro Abs: 1469 cells/uL — ABNORMAL LOW (ref 1500–7800)
Neutrophils Relative %: 44.5 %
Platelets: 226 10*3/uL (ref 140–400)
RBC: 4.44 10*6/uL (ref 3.80–5.10)
RDW: 12.1 % (ref 11.0–15.0)
Total Lymphocyte: 45 %
WBC: 3.3 10*3/uL — ABNORMAL LOW (ref 3.8–10.8)

## 2021-11-15 LAB — COMPREHENSIVE METABOLIC PANEL
AG Ratio: 1.6 (calc) (ref 1.0–2.5)
ALT: 18 U/L (ref 6–29)
AST: 17 U/L (ref 10–35)
Albumin: 4.1 g/dL (ref 3.6–5.1)
Alkaline phosphatase (APISO): 50 U/L (ref 31–125)
BUN: 13 mg/dL (ref 7–25)
CO2: 27 mmol/L (ref 20–32)
Calcium: 9.2 mg/dL (ref 8.6–10.2)
Chloride: 106 mmol/L (ref 98–110)
Creat: 0.89 mg/dL (ref 0.50–0.99)
Globulin: 2.6 g/dL (calc) (ref 1.9–3.7)
Glucose, Bld: 87 mg/dL (ref 65–99)
Potassium: 3.4 mmol/L — ABNORMAL LOW (ref 3.5–5.3)
Sodium: 142 mmol/L (ref 135–146)
Total Bilirubin: 0.8 mg/dL (ref 0.2–1.2)
Total Protein: 6.7 g/dL (ref 6.1–8.1)

## 2021-11-15 LAB — HEMOGLOBIN A1C
Hgb A1c MFr Bld: 5.1 % of total Hgb (ref <5.7)
Mean Plasma Glucose: 100 mg/dL
eAG (mmol/L): 5.5 mmol/L

## 2021-11-15 LAB — LIPID PANEL
Cholesterol: 144 mg/dL (ref <200)
HDL: 66 mg/dL (ref >=50)
LDL Cholesterol (Calc): 61 mg/dL (calc)
Non-HDL Cholesterol (Calc): 78 mg/dL (calc) (ref <130)
Total CHOL/HDL Ratio: 2.2 (calc) (ref <5.0)
Triglycerides: 88 mg/dL (ref <150)

## 2021-11-16 ENCOUNTER — Encounter: Payer: Self-pay | Admitting: *Deleted

## 2021-11-28 LAB — COLOGUARD: COLOGUARD: NEGATIVE

## 2021-11-29 ENCOUNTER — Encounter: Payer: Self-pay | Admitting: *Deleted

## 2021-11-30 ENCOUNTER — Ambulatory Visit: Payer: BC Managed Care – PPO | Admitting: Nurse Practitioner

## 2021-12-28 ENCOUNTER — Emergency Department (HOSPITAL_COMMUNITY)
Admission: EM | Admit: 2021-12-28 | Discharge: 2021-12-28 | Disposition: A | Discharge status: Home / Self Care | Payer: BC Managed Care – PPO | Attending: Emergency Medicine | Admitting: Emergency Medicine

## 2021-12-28 ENCOUNTER — Encounter: Payer: Self-pay | Admitting: Nurse Practitioner

## 2021-12-28 ENCOUNTER — Emergency Department (HOSPITAL_COMMUNITY): Payer: BC Managed Care – PPO

## 2021-12-28 ENCOUNTER — Ambulatory Visit (INDEPENDENT_AMBULATORY_CARE_PROVIDER_SITE_OTHER): Payer: BC Managed Care – PPO | Admitting: Nurse Practitioner

## 2021-12-28 DIAGNOSIS — G4733 Obstructive sleep apnea (adult) (pediatric): Secondary | ICD-10-CM

## 2021-12-28 DIAGNOSIS — I160 Hypertensive urgency: Secondary | ICD-10-CM | POA: Insufficient documentation

## 2021-12-28 DIAGNOSIS — I10 Essential (primary) hypertension: Secondary | ICD-10-CM | POA: Diagnosis present

## 2021-12-28 DIAGNOSIS — Z79899 Other long term (current) drug therapy: Secondary | ICD-10-CM | POA: Diagnosis not present

## 2021-12-28 LAB — BASIC METABOLIC PANEL
Anion gap: 5 (ref 5–15)
BUN: 14 mg/dL (ref 6–20)
CO2: 28 mmol/L (ref 22–32)
Calcium: 8.9 mg/dL (ref 8.9–10.3)
Chloride: 106 mmol/L (ref 98–111)
Creatinine, Ser: 1.06 mg/dL — ABNORMAL HIGH (ref 0.44–1.00)
GFR, Estimated: >60 mL/min (ref >60)
Glucose, Bld: 80 mg/dL (ref 70–99)
Potassium: 3.7 mmol/L (ref 3.5–5.1)
Sodium: 139 mmol/L (ref 135–145)

## 2021-12-28 LAB — CBC WITH DIFFERENTIAL/PLATELET
Abs Immature Granulocytes: 0.01 10*3/uL (ref 0.00–0.07)
Basophils Absolute: 0 10*3/uL (ref 0.0–0.1)
Basophils Relative: 1 %
Eosinophils Absolute: 0.3 10*3/uL (ref 0.0–0.5)
Eosinophils Relative: 6 %
HCT: 41.5 % (ref 36.0–46.0)
Hemoglobin: 14.2 g/dL (ref 12.0–15.0)
Immature Granulocytes: 0 %
Lymphocytes Relative: 39 %
Lymphs Abs: 1.6 10*3/uL (ref 0.7–4.0)
MCH: 32.6 pg (ref 26.0–34.0)
MCHC: 34.2 g/dL (ref 30.0–36.0)
MCV: 95.4 fL (ref 80.0–100.0)
Monocytes Absolute: 0.4 10*3/uL (ref 0.1–1.0)
Monocytes Relative: 10 %
Neutro Abs: 1.8 10*3/uL (ref 1.7–7.7)
Neutrophils Relative %: 44 %
Platelets: 225 10*3/uL (ref 150–400)
RBC: 4.35 MIL/uL (ref 3.87–5.11)
RDW: 12.7 % (ref 11.5–15.5)
WBC: 4.1 10*3/uL (ref 4.0–10.5)
nRBC: 0 % (ref 0.0–0.2)

## 2021-12-28 LAB — I-STAT BETA HCG BLOOD, ED (MC, WL, AP ONLY): I-stat hCG, quantitative: <5 m[IU]/mL (ref <5)

## 2021-12-28 LAB — TROPONIN I (HIGH SENSITIVITY)
Troponin I (High Sensitivity): 3 ng/L (ref <18)
Troponin I (High Sensitivity): 3 ng/L (ref <18)

## 2021-12-28 LAB — URINALYSIS, ROUTINE W REFLEX MICROSCOPIC
Bilirubin Urine: NEGATIVE
Glucose, UA: NEGATIVE mg/dL
Hgb urine dipstick: NEGATIVE
Ketones, ur: NEGATIVE mg/dL
Leukocytes,Ua: NEGATIVE
Nitrite: NEGATIVE
Protein, ur: NEGATIVE mg/dL
Specific Gravity, Urine: 1.021 (ref 1.005–1.030)
pH: 5 (ref 5.0–8.0)

## 2021-12-28 MED ORDER — ACETAMINOPHEN 500 MG PO TABS
1000.0000 mg | ORAL_TABLET | Freq: Once | ORAL | Status: AC
  Administered 2021-12-28: 1000 mg via ORAL
  Filled 2021-12-28: qty 2

## 2021-12-28 MED ORDER — HYDRALAZINE HCL 20 MG/ML IJ SOLN
10.0000 mg | Freq: Once | INTRAMUSCULAR | Status: AC
Start: 2021-12-28 — End: 2021-12-28
  Administered 2021-12-28: 10 mg via INTRAMUSCULAR
  Filled 2021-12-28: qty 1

## 2021-12-28 MED ORDER — AMLODIPINE BESYLATE 5 MG PO TABS
10.0000 mg | ORAL_TABLET | Freq: Once | ORAL | Status: AC
  Administered 2021-12-28: 10 mg via ORAL
  Filled 2021-12-28: qty 2

## 2021-12-28 MED ORDER — AMLODIPINE BESYLATE 5 MG PO TABS: 5.0000 mg | ORAL_TABLET | Freq: Every day | ORAL | 1 refills | Status: DC

## 2021-12-28 NOTE — Patient Instructions (Addendum)
Continue CPAP every night, minimum of 4-6 hours a night.  Change equipment every 30 days or as directed by DME. Wash your tubing with warm soap and water daily, hang to dry. Wash humidifier portion weekly.  Be aware of reduced alertness and do not drive or operate heavy machinery if experiencing this or drowsiness.  Healthy weight management discussed.  Avoid or decrease alcohol consumption and medications that make you more sleepy, if possible. Notify if persistent daytime sleepiness occurs even with consistent use of CPAP.   Follow up in 6 months with Dr. Halford Chessman. If symptoms do not improve or worsen, please contact office for sooner follow up or seek emergency care.

## 2021-12-28 NOTE — Discharge Instructions (Signed)
1.  You may start amlodipine 5 mg daily for high blood pressure.  Continue taking your Toprol XL 57m and HCTZ. 2.  If your blood pressures remain elevated above 140s over 90s on the combination of amlodipine 5 mg and Toprol, you can increase your amlodipine dose to 123m 3.  Follow-up with your doctor for blood pressure monitoring.  Keep a journal of your heart rate and blood pressure so adjustments can be made to your Toprol and amlodipine as needed. 4.  Return to the emergency department if you have any concerning symptoms such as headache, blurred vision, chest pain, lightheadedness or other concerning changes.

## 2021-12-28 NOTE — Progress Notes (Signed)
Reviewed and agree with assessment/plan.   Chesley Mires, MD [facility] Pulmonary/Critical Care 12/28/2021, 4:53 PM Pager:  [phone]

## 2021-12-28 NOTE — ED Provider Notes (Signed)
[facility] DEPT Provider Note   CSN: [id number] Arrival date & time: 12/28/21  1824     History  Chief Complaint  Patient presents with   Hypertension    Leslie Campbell is a 46 y.o. female.  HPI Patient reports she went to see the dentist yesterday and her blood pressure was elevated.  She reports that she followed up with her doctor today and her blood pressure was found to be very elevated in the 643P systolic.  Patient reports she has been compliant with her medication and there have been no recent changes.  She takes Toprol-XL 50 mg daily and hydrochlorothiazide daily.  She denies any missed doses.  No recent change visit lifestyle.  Patient does not smoke, drink alcohol or use drugs.  No significant dietary changes.  She did start taking a Centrum multivitamin.  She was advised by her doctor to come to the emergency department for treatment    Home Medications Prior to Admission medications   Medication Sig Start Date End Date Taking? Authorizing Provider  albuterol (VENTOLIN HFA) 108 (90 Base) MCG/ACT inhaler INHALE 2 PUFFS INTO THE LUNGS EVERY 4 (FOUR) HOURS AS NEEDED (COUGH, TIGHT CHEST OR WHEEZING). Patient taking differently: Inhale 2 puffs into the lungs every 4 (four) hours as needed for shortness of breath or wheezing. 04/18/20  Yes Tower, Wynelle Fanny, MD  amLODipine (NORVASC) 5 MG tablet Take 1 tablet (5 mg total) by mouth daily. 12/28/21  Yes Jerika Wales, Jeannie Done, MD  atorvastatin (LIPITOR) 10 MG tablet TAKE 1 TABLET BY MOUTH EVERY DAY Patient taking differently: Take 10 mg by mouth daily. 11/07/21  Yes Tower, Wynelle Fanny, MD  famotidine (PEPCID) 20 MG tablet TAKE 1 TABLET BY MOUTH TWICE A DAY Patient taking differently: Take 20 mg by mouth 2 (two) times daily. 11/07/21  Yes Tower, Wynelle Fanny, MD  fluticasone (FLONASE) 50 MCG/ACT nasal spray SPRAY 2 SPRAYS INTO EACH NOSTRIL EVERY DAY AS NEEDED Patient taking differently: Place 2 sprays into both nostrils  daily as needed for allergies. 11/07/21  Yes Tower, Wynelle Fanny, MD  hydrochlorothiazide (HYDRODIURIL) 25 MG tablet TAKE 1 TABLET (25 MG TOTAL) BY MOUTH DAILY. Patient taking differently: Take 25 mg by mouth daily. 11/07/21  Yes Tower, Wynelle Fanny, MD  metoprolol succinate (TOPROL-XL) 50 MG 24 hr tablet TAKE 1 TABLET BY MOUTH DAILY. TAKE WITH OR IMMEDIATELY FOLLOWING A MEAL. Patient taking differently: Take 50 mg by mouth daily. 11/07/21  Yes Tower, Wynelle Fanny, MD  potassium chloride (KLOR-CON) 10 MEQ tablet TAKE 1 TABLET BY MOUTH EVERY DAY Patient taking differently: Take 10 mEq by mouth daily. 11/07/21  Yes Tower, Wynelle Fanny, MD  cyclobenzaprine (FLEXERIL) 10 MG tablet Take 10 mg by mouth 2 (two) times daily as needed for muscle spasms. Patient not taking: Reported on 12/28/2021 05/09/21   [provider]      Allergies    Patient has no known allergies.    Review of Systems   Review of Systems 10 systems reviewed and negative except as per HPI Physical Exam Updated Vital Signs BP (!) 145/95   Pulse 73   Temp 98.3 F (36.8 C) (Oral)   Resp 20   SpO2 100%  Physical Exam Constitutional:      Comments: Alert nontoxic well in appearance.  No respiratory distress.  HENT:     Head: Normocephalic and atraumatic.     Nose: Nose normal.     Mouth/Throat:     Mouth: Mucous membranes  are moist.     Pharynx: Oropharynx is clear.  Eyes:     Extraocular Movements: Extraocular movements intact.     Pupils: Pupils are equal, round, and reactive to light.  Cardiovascular:     Rate and Rhythm: Normal rate and regular rhythm.  Pulmonary:     Effort: Pulmonary effort is normal.     Breath sounds: Normal breath sounds.  Abdominal:     General: There is no distension.     Palpations: Abdomen is soft.     Tenderness: There is no abdominal tenderness. There is no guarding.  Musculoskeletal:        General: No swelling or tenderness. Normal range of motion.     Cervical back: Neck supple.     Right lower  leg: No edema.     Left lower leg: No edema.  Skin:    General: Skin is warm and dry.  Neurological:     General: No focal deficit present.     Mental Status: She is oriented to person, place, and time.     Motor: No weakness.     Coordination: Coordination normal.  Psychiatric:        Mood and Affect: Mood normal.     ED Results / Procedures / Treatments   Labs (all labs ordered are listed, but only abnormal results are displayed) Labs Reviewed  BASIC METABOLIC PANEL - Abnormal; Notable for the following components:      Result Value   Creatinine, Ser 1.06 (*)    All other components within normal limits  CBC WITH DIFFERENTIAL/PLATELET  URINALYSIS, ROUTINE W REFLEX MICROSCOPIC  I-STAT BETA HCG BLOOD, ED ([facility], [facility], [facility] ONLY)  TROPONIN I (HIGH SENSITIVITY)  TROPONIN I (HIGH SENSITIVITY)    EKG EKG Interpretation  Date/Time:  Thursday December 28 2021 19:34:01 EDT Ventricular Rate:  55 PR Interval:  167 QRS Duration: 76 QT Interval:  431 QTC Calculation: 413 R Axis:   60 Text Interpretation: Sinus rhythm ST elev, probable normal early repol pattern agree. subtle anterior repolarization change compared to 2018 tracing Confirmed by Charlesetta Shanks [phone]) on 12/28/2021 10:25:16 PM  Radiology DG Chest Port 1 View  Result Date: 12/28/2021 CLINICAL DATA:  Hypertension. EXAM: PORTABLE CHEST 1 VIEW COMPARISON:  Chest x-ray 05/05/2007 FINDINGS: The heart size and mediastinal contours are within normal limits. Both lungs are clear. The visualized skeletal structures are unremarkable. IMPRESSION: No active disease. Electronically Signed   By: Ronney Asters M.D.   On: 12/28/2021 20:38    Procedures Procedures    Medications Ordered in ED Medications  amLODipine (NORVASC) tablet 10 mg (10 mg Oral Given 12/28/21 2019)  hydrALAZINE (APRESOLINE) injection 10 mg (10 mg Intramuscular Given 12/28/21 2038)  acetaminophen (TYLENOL) tablet 1,000 mg (1,000 mg Oral Given 12/28/21 2128)    ED  Course/ Medical Decision Making/ A&P                           Medical Decision Making Amount and/or Complexity of Data Reviewed Labs: ordered. Radiology: ordered.  Risk OTC drugs. Prescription drug management.   Patient presents with significantly elevated blood pressures but does not have signs of endorgan damage.  Pressure documented as high as 232/109 in triage however it appears 160s over 100 have been more consistent over the past couple of days.  Patient reports at baseline however blood pressures are controlled in the 120s over 70s or 80s.  Will obtain EKG, troponins and  basic lab work.  Chest x-ray entered by radiology no acute findings.  No cardiomegaly or mediastinal enlargement.  First troponin is normal.  Patient is given hydralazine 10 mg IV and amlodipine 10 mg orally.  Blood pressure has improved over several hours to 145/95.  Patient is approaching her baseline blood pressures.  At this time plan will be to add amlodipine 5 mg to the patient's usual regimen of Toprol XL 50 mg and hydrochlorothiazide.  Recommendations for close monitoring of blood pressures and adjustments can be made as needed.  Return precautions reviewed.          Final Clinical Impression(s) / ED Diagnoses Final diagnoses:  Hypertensive urgency    Rx / DC Orders ED Discharge Orders          Ordered    amLODipine (NORVASC) 5 MG tablet  Daily        12/28/21 2213              Charlesetta Shanks, MD 12/28/21 2230

## 2021-12-28 NOTE — Assessment & Plan Note (Signed)
Well-controlled on current settings and has received good benefit from use.  She is tolerating it well.  Encouraged nightly use.  No changes made today.  Patient Instructions  Continue CPAP every night, minimum of 4-6 hours a night.  Change equipment every 30 days or as directed by DME. Wash your tubing with warm soap and water daily, hang to dry. Wash humidifier portion weekly.  Be aware of reduced alertness and do not drive or operate heavy machinery if experiencing this or drowsiness.  Healthy weight management discussed.  Avoid or decrease alcohol consumption and medications that make you more sleepy, if possible. Notify if persistent daytime sleepiness occurs even with consistent use of CPAP.   Follow up in 6 months with Dr. Halford Chessman. If symptoms do not improve or worsen, please contact office for sooner follow up or seek emergency care.

## 2021-12-28 NOTE — ED Triage Notes (Signed)
Patient states she has a history of hypertension and states her BP today was 162/106. Patient states she had a headache earlier in the day, but none now. Patient also denies blurred vision or dizziness.  Patient states she is compliant with her meds.  BP in triage-232/109

## 2021-12-28 NOTE — Progress Notes (Signed)
@Patient  ID: Leslie Campbell, female    DOB: 05-30-1976, 46 y.o.   MRN: [id number]  Chief Complaint  Patient presents with   Follow-up    Patient here for cpap f/u. Patient states that she's been sleeping better.     Referring provider: Tower, Wynelle Fanny, MD  HPI: 46 year old female, never smoker followed for mild obstructive sleep apnea.  She is a patient of Dr. Juanetta Gosling and last seen in office 08/07/2021 for sleep consult.  Past medical history significant for hypertension, HLD, migraine headaches, PCOS, pituitary hyperfunction.  TEST/EVENTS:  10/19/2020 HST: AHI 8.5, SPO2 low 82%  08/07/2021: OV with Dr Halford Chessman.  Recently seen by PCP who referred her due to reports of snoring and episodes of apnea while sleeping.  She also had excessive daytime fatigue symptoms.  Wakes feeling tired in the morning.  Has trouble staying awake when watching TV and concentrating during the day.  Reported that she started a job about 2 years ago and has lost about 50 pounds since.  Gets around 8 to 9 hours sleep at night, wakes to use the restroom.  Uses melatonin occasionally to help her fall asleep.  HST ordered for further evaluation given concern for OSA.  10/30/2021: OV with Fredy Gladu NP today for follow-up to discuss home sleep study results which showed mild obstructive sleep apnea.  She continues to have daytime fatigue symptoms and snoring.  Denies any morning headaches or drowsy driving.  She does have occasional intermittent headaches throughout the day.  Has trouble staying asleep when watching TV.  Continues to sleep around 8 to 9 hours a night.  Rarely requires something to help her fall asleep but will use melatonin on occasion. Started on CPAP auto 5-15 cmH2O.  12/28/2021: Today - follow up Patient presents today for follow up after being started on CPAP therapy. She has been tolerating it well and feels like her sleep is much more restful than it used to be. There have been 3 or 4 nights that she has not noticed  it and she definitely notices a difference when off of it. She denies any morning headaches or drowsy driving. No concerns or complaints today.  11/01/2021-12/27/2021 CPAP auto 5-15 cmH2O download 26/30 days (77%> 4 hours); average usage 6 hours 17 minutes Pressure median 5.8, 95th 7.9 Leaks median 3.7, 95th 27.6 AHI 0.7  No Known Allergies  Immunization History  Administered Date(s) Administered   Influenza Split 05/16/2011   Influenza,inj,Quad PF,6+ Mos 04/26/2014, 02/28/2016, 03/13/2017, 03/27/2021   PFIZER(Purple Top)SARS-COV-2 Vaccination 02/11/2020, 03/03/2020   Td 04/02/2003   Tdap 04/26/2014, 10/04/2016    Past Medical History:  Diagnosis Date   HTN (hypertension)    Hyperlipidemia    no meds   Infertility associated with anovulation    Infertility, female    clomid   Migraine    otc med prn   Miscarriage 01/2010   Miscarriage 2012   x 5 - only 1 required surgery   PCOS (polycystic ovarian syndrome)    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HCC)     Tobacco History: Social History   Tobacco Use  Smoking Status Never  Smokeless Tobacco Never   Counseling given: Not Answered   Outpatient Medications Prior to Visit  Medication Sig Dispense Refill   albuterol (VENTOLIN HFA) 108 (90 Base) MCG/ACT inhaler INHALE 2 PUFFS INTO THE LUNGS EVERY 4 (FOUR) HOURS AS NEEDED (COUGH, TIGHT CHEST OR WHEEZING). 18 each 3   atorvastatin (LIPITOR) 10 MG tablet TAKE 1 TABLET BY MOUTH  EVERY DAY 90 tablet 0   cyclobenzaprine (FLEXERIL) 10 MG tablet Take 10 mg by mouth 2 (two) times daily as needed.     famotidine (PEPCID) 20 MG tablet TAKE 1 TABLET BY MOUTH TWICE A DAY 180 tablet 0   fluticasone (FLONASE) 50 MCG/ACT nasal spray SPRAY 2 SPRAYS INTO EACH NOSTRIL EVERY DAY AS NEEDED 48 mL 0   hydrochlorothiazide (HYDRODIURIL) 25 MG tablet TAKE 1 TABLET (25 MG TOTAL) BY MOUTH DAILY. 90 tablet 0   metoprolol succinate (TOPROL-XL) 50 MG 24 hr tablet TAKE 1 TABLET BY MOUTH DAILY. TAKE WITH OR  IMMEDIATELY FOLLOWING A MEAL. 90 tablet 0   potassium chloride (KLOR-CON) 10 MEQ tablet TAKE 1 TABLET BY MOUTH EVERY DAY 90 tablet 0   No facility-administered medications prior to visit.     Review of Systems:   Constitutional: No weight loss or gain, night sweats, fevers, chills + fatigue (improving) HEENT: No headaches, difficulty swallowing, tooth/dental problems, or sore throat. No sneezing, itching, ear ache, nasal congestion, or post nasal drip.  CV:  No chest pain, orthopnea, PND, swelling in lower extremities, anasarca, dizziness, palpitations, syncope Resp: No shortness of breath with exertion or at rest. No excess mucus or change in color of mucus. No productive or non-productive. No hemoptysis. No wheezing.  No chest wall deformity Skin: No rash, lesions, ulcerations MSK:  No joint pain or swelling.  No decreased range of motion.  No back pain. Neuro: No dizziness or lightheadedness.  Psych: No depression or anxiety. Mood stable.     Physical Exam:  BP (!) 162/98 (BP Location: Right Arm, Patient Position: Sitting, Cuff Size: Normal)   Pulse 71   Temp 98.4 F (36.9 C) (Oral)   Ht 5' 4"  (1.626 m)   Wt 180 lb 12.8 oz (82 kg)   SpO2 100%   BMI 31.03 kg/m   GEN: Pleasant, interactive, well-appearing; obese; in no acute distress. HEENT:  Normocephalic and atraumatic.  PERRLA. Sclera white. Nasal turbinates pink, moist and patent bilaterally. No rhinorrhea present. Oropharynx pink and moist, without exudate or edema. No lesions, ulcerations, or postnasal drip.  NECK:  Supple w/ fair ROM. No JVD present. Normal carotid impulses w/o bruits. Thyroid symmetrical with no goiter or nodules palpated. No lymphadenopathy.   CV: RRR, no m/r/g, no peripheral edema. Pulses intact, +2 bilaterally. No cyanosis, pallor or clubbing. PULMONARY:  Unlabored, regular breathing. Clear bilaterally A&P w/o wheezes/rales/rhonchi. No accessory muscle use. No dullness to percussion. GI: BS present  and normoactive. Soft, non-tender to palpation. MSK: No erythema, warmth or tenderness. Cap refil <2 sec all extrem. No deformities or joint swelling noted.  Neuro: A/Ox3. No focal deficits noted.   Skin: Warm, no lesions or rashe Psych: Normal affect and behavior. Judgement and thought content appropriate.     Lab Results:  CBC    Component Value Date/Time   WBC 3.3 (L) 11/14/2021 1442   RBC 4.44 11/14/2021 1442   HGB 14.2 11/14/2021 1442   HGB 13.5 03/13/2017 1700   HCT 42.3 11/14/2021 1442   HCT 39.7 03/13/2017 1700   PLT 226 11/14/2021 1442   PLT 300 03/13/2017 1700   MCV 95.3 11/14/2021 1442   MCV 90 03/13/2017 1700   MCH 32.0 11/14/2021 1442   MCHC 33.6 11/14/2021 1442   RDW 12.1 11/14/2021 1442   RDW 14.7 03/13/2017 1700   LYMPHSABS 1,485 11/14/2021 1442   LYMPHSABS 1.8 03/13/2017 1700   MONOABS 0.4 04/19/2014 0813   EOSABS 69 11/14/2021 1442  EOSABS 0.2 03/13/2017 1700   BASOSABS 20 11/14/2021 1442   BASOSABS 0.0 03/13/2017 1700    BMET    Component Value Date/Time   NA 142 11/14/2021 1442   NA 141 03/13/2017 1700   K 3.4 (L) 11/14/2021 1442   CL 106 11/14/2021 1442   CO2 27 11/14/2021 1442   GLUCOSE 87 11/14/2021 1442   BUN 13 11/14/2021 1442   BUN 12 03/13/2017 1700   CREATININE 0.89 11/14/2021 1442   CALCIUM 9.2 11/14/2021 1442   GFRNONAA 66 03/13/2017 1700   GFRAA 76 03/13/2017 1700    BNP No results found for: "BNP"   Imaging:  No results found.        No data to display          No results found for: "NITRICOXIDE"      Assessment & Plan:   Mild obstructive sleep apnea Well-controlled on current settings and has received good benefit from use.  She is tolerating it well.  Encouraged nightly use.  No changes made today.  Patient Instructions  Continue CPAP every night, minimum of 4-6 hours a night.  Change equipment every 30 days or as directed by DME. Wash your tubing with warm soap and water daily, hang to dry. Wash  humidifier portion weekly.  Be aware of reduced alertness and do not drive or operate heavy machinery if experiencing this or drowsiness.  Healthy weight management discussed.  Avoid or decrease alcohol consumption and medications that make you more sleepy, if possible. Notify if persistent daytime sleepiness occurs even with consistent use of CPAP.   Follow up in 6 months with Dr. Halford Chessman. If symptoms do not improve or worsen, please contact office for sooner follow up or seek emergency care.     I spent 22 minutes of dedicated to the care of this patient on the date of this encounter to include pre-visit review of records, face-to-face time with the patient discussing conditions above, post visit ordering of testing, clinical documentation with the electronic health record, making appropriate referrals as documented, and communicating necessary findings to members of the patients care team.  Clayton Bibles, NP 12/28/2021  Pt aware and understands NP's role.

## 2022-01-12 ENCOUNTER — Ambulatory Visit: Payer: BC Managed Care – PPO | Admitting: Family Medicine

## 2022-01-12 ENCOUNTER — Encounter: Payer: Self-pay | Admitting: Family Medicine

## 2022-01-12 VITALS — BP 132/86 | HR 76 | Ht 64.0 in | Wt 177.4 lb

## 2022-01-12 DIAGNOSIS — I10 Essential (primary) hypertension: Secondary | ICD-10-CM

## 2022-01-12 MED ORDER — AMLODIPINE BESYLATE 5 MG PO TABS: 5.0000 mg | ORAL_TABLET | Freq: Every day | ORAL | 3 refills | Status: DC

## 2022-01-12 NOTE — Assessment & Plan Note (Signed)
Pt was seen for hypertensive urgency recently in ER Reviewed hospital records, lab results and studies in detail  Reassuring work up  Improved with dose of hydralazine and amlodipine in ER Now added amlodipine 5 mg to regular meds Continues metoprolol xl 50 mg daily and hctz 25 mg daily  Disc imp of water intake Also sodium avoidance DASH eating handout given  Will continue to monitor at home

## 2022-01-12 NOTE — Progress Notes (Signed)
Subjective:    Patient ID: Leslie Campbell, female    DOB: [birth year], 46 y.o.   MRN: [id number]  HPI Pt presents for f/u of hypertensive urgency in ER  Wt Readings from Last 3 Encounters:  01/12/22 177 lb 6.4 oz (80.5 kg)  12/28/21 180 lb 12.8 oz (82 kg)  11/14/21 182 lb (82.6 kg)   30.45 kg/m   She was seen in ER after elevated bp in office (over 200 systolic)  She felt ok  After it came down her head hurt for a few days   No missed toprol or hctz at that time  A new women's centrum mvi (? If it had a lot of sodium)  May have been a little dehydrated in the heat that day  Had a gatorade also   Is always stressed  Caring for her mom    BP Readings from Last 3 Encounters:  01/12/22 132/86  12/28/21 (!) 145/95  12/28/21 (!) 162/98   Pulse Readings from Last 3 Encounters:  01/12/22 76  12/28/21 73  12/28/21 71   This am 122/85  Doing well at home   EKG NSR rate of 55 with some repolarization pattern  DG Chest Port 1 View  Result Date: 12/28/2021 CLINICAL DATA:  Hypertension. EXAM: PORTABLE CHEST 1 VIEW COMPARISON:  Chest x-ray 05/05/2007 FINDINGS: The heart size and mediastinal contours are within normal limits. Both lungs are clear. The visualized skeletal structures are unremarkable. IMPRESSION: No active disease. Electronically Signed   By: Darliss Cheney M.D.   On: 12/28/2021 20:38    Results for orders placed or performed during the hospital encounter of 12/28/21  Basic metabolic panel  Result Value Ref Range   Sodium 139 135 - 145 mmol/L   Potassium 3.7 3.5 - 5.1 mmol/L   Chloride 106 98 - 111 mmol/L   CO2 28 22 - 32 mmol/L   Glucose, Bld 80 70 - 99 mg/dL   BUN 14 6 - 20 mg/dL   Creatinine, Ser 4.23 (H) 0.44 - 1.00 mg/dL   Calcium 8.9 8.9 - 53.6 mg/dL   GFR, Estimated >14 >43 mL/min   Anion gap 5 5 - 15  CBC with Differential  Result Value Ref Range   WBC 4.1 4.0 - 10.5 K/uL   RBC 4.35 3.87 - 5.11 MIL/uL   Hemoglobin 14.2 12.0 - 15.0 g/dL   HCT  15.4 00.8 - 67.6 %   MCV 95.4 80.0 - 100.0 fL   MCH 32.6 26.0 - 34.0 pg   MCHC 34.2 30.0 - 36.0 g/dL   RDW 19.5 09.3 - 26.7 %   Platelets 225 150 - 400 K/uL   nRBC 0.0 0.0 - 0.2 %   Neutrophils Relative % 44 %   Neutro Abs 1.8 1.7 - 7.7 K/uL   Lymphocytes Relative 39 %   Lymphs Abs 1.6 0.7 - 4.0 K/uL   Monocytes Relative 10 %   Monocytes Absolute 0.4 0.1 - 1.0 K/uL   Eosinophils Relative 6 %   Eosinophils Absolute 0.3 0.0 - 0.5 K/uL   Basophils Relative 1 %   Basophils Absolute 0.0 0.0 - 0.1 K/uL   Immature Granulocytes 0 %   Abs Immature Granulocytes 0.01 0.00 - 0.07 K/uL  Urinalysis, Routine w reflex microscopic Urine, Clean Catch  Result Value Ref Range   Color, Urine YELLOW YELLOW   APPearance CLEAR CLEAR   Specific Gravity, Urine 1.021 1.005 - 1.030   pH 5.0 5.0 - 8.0  Glucose, UA NEGATIVE NEGATIVE mg/dL   Hgb urine dipstick NEGATIVE NEGATIVE   Bilirubin Urine NEGATIVE NEGATIVE   Ketones, ur NEGATIVE NEGATIVE mg/dL   Protein, ur NEGATIVE NEGATIVE mg/dL   Nitrite NEGATIVE NEGATIVE   Leukocytes,Ua NEGATIVE NEGATIVE  I-Stat Beta hCG blood, ED ([facility], [facility], [facility] only)  Result Value Ref Range   I-stat hCG, quantitative <5.0 <5 mIU/mL   Comment 3          Troponin I (High Sensitivity)  Result Value Ref Range   Troponin I (High Sensitivity) 3 <18 ng/L  Troponin I (High Sensitivity)  Result Value Ref Range   Troponin I (High Sensitivity) 3 <18 ng/L   She was tx with hydralazine 10 mg IV and amlodipine 10 mg oral  Bp came down to 145/95   Added amlodipine 5 mg to her usual regimen and told to f/u  Also takes metoprolol xl 50 mg daily  Hctz 25 mg daily - decided to start taking in am now and doing better   Atorvastatin 10 mg daily for lipids Lab Results  Component Value Date   CHOL 144 11/14/2021   HDL 66 11/14/2021   LDLCALC 61 11/14/2021   LDLDIRECT 169.5 08/03/2008   TRIG 88 11/14/2021   CHOLHDL 2.2 11/14/2021   Patient Active Problem List   Diagnosis Date Noted    Colon cancer screening 11/14/2021   Mild obstructive sleep apnea 10/30/2021   Snoring 05/17/2021   Screening-pulmonary TB 03/27/2021   Routine general medical examination at a health care facility 03/13/2017   Carpal tunnel syndrome 03/13/2017   Chronic hypertension affecting pregnancy 12/04/2016   IUGR (intrauterine growth restriction) affecting care of mother, third trimester, fetus 1 12/04/2016   Encounter for health maintenance examination 04/18/2014   Obesity (BMI 30-39.9) 06/10/2009   POLYCYSTIC OVARIAN DISEASE 03/15/2009   Hyperlipidemia 08/03/2008   HYPERTENSION, BENIGN ESSENTIAL 08/03/2008   Past Medical History:  Diagnosis Date   HTN (hypertension)    Hyperlipidemia    no meds   Infertility associated with anovulation    Infertility, female    clomid   Migraine    otc med prn   Miscarriage 01/2010   Miscarriage 2012   x 5 - only 1 required surgery   PCOS (polycystic ovarian syndrome)    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HCC)    Past Surgical History:  Procedure Laterality Date   CCY     CERVICAL CERCLAGE N/A 06/22/2016   Procedure: McDonald CERCLAGE CERVICAL;  Surgeon: Olivia Mackie, MD;  Location: [facility];  Service: Gynecology;  Laterality: N/A;  EDD: 12/25/16   CESAREAN SECTION N/A 12/04/2016   Procedure: CESAREAN SECTION;  Surgeon: Olivia Mackie, MD;  Location: [facility];  Service: Obstetrics;  Laterality: N/A;   CHOLECYSTECTOMY     DILATION AND CURETTAGE OF UTERUS     MAB   LAPAROSCOPIC LYSIS OF ADHESIONS  01/21/2017   Procedure: LAPAROSCOPIC LYSIS OF ADHESIONS;  Surgeon: Olivia Mackie, MD;  Location: [facility];  Service: Gynecology;;   LAPAROSCOPIC OVARIAN CYSTECTOMY Bilateral    LAPAROSCOPIC TUBAL LIGATION Bilateral 01/21/2017   Procedure: LAPAROSCOPIC TUBAL LIGATION;  Surgeon: Olivia Mackie, MD;  Location: [facility];  Service: Gynecology;  Laterality: Bilateral;   miscarriage  01/2010   WISDOM TOOTH EXTRACTION     Social History   Tobacco Use   Smoking  status: Never   Smokeless tobacco: Never  Vaping Use   Vaping Use: Never used  Substance Use Topics   Alcohol use: No    Alcohol/week:  0.0 standard drinks of alcohol   Drug use: No   Family History  Problem Relation Age of Onset   Hypertension Mother    Hypertension Father    Diabetes Brother    Diabetes Other    No Known Allergies Current Outpatient Medications on File Prior to Visit  Medication Sig Dispense Refill   albuterol (VENTOLIN HFA) 108 (90 Base) MCG/ACT inhaler INHALE 2 PUFFS INTO THE LUNGS EVERY 4 (FOUR) HOURS AS NEEDED (COUGH, TIGHT CHEST OR WHEEZING). (Patient taking differently: Inhale 2 puffs into the lungs every 4 (four) hours as needed for shortness of breath or wheezing.) 18 each 3   atorvastatin (LIPITOR) 10 MG tablet TAKE 1 TABLET BY MOUTH EVERY DAY (Patient taking differently: Take 10 mg by mouth daily.) 90 tablet 0   cyclobenzaprine (FLEXERIL) 10 MG tablet Take 10 mg by mouth 2 (two) times daily as needed for muscle spasms.     famotidine (PEPCID) 20 MG tablet TAKE 1 TABLET BY MOUTH TWICE A DAY (Patient taking differently: Take 20 mg by mouth 2 (two) times daily.) 180 tablet 0   fluticasone (FLONASE) 50 MCG/ACT nasal spray SPRAY 2 SPRAYS INTO EACH NOSTRIL EVERY DAY AS NEEDED (Patient taking differently: Place 2 sprays into both nostrils daily as needed for allergies.) 48 mL 0   hydrochlorothiazide (HYDRODIURIL) 25 MG tablet TAKE 1 TABLET (25 MG TOTAL) BY MOUTH DAILY. (Patient taking differently: Take 25 mg by mouth daily.) 90 tablet 0   metoprolol succinate (TOPROL-XL) 50 MG 24 hr tablet TAKE 1 TABLET BY MOUTH DAILY. TAKE WITH OR IMMEDIATELY FOLLOWING A MEAL. (Patient taking differently: Take 50 mg by mouth daily.) 90 tablet 0   potassium chloride (KLOR-CON) 10 MEQ tablet TAKE 1 TABLET BY MOUTH EVERY DAY (Patient taking differently: Take 10 mEq by mouth daily.) 90 tablet 0   No current facility-administered medications on file prior to visit.    Review of Systems   Constitutional:  Positive for fatigue. Negative for activity change, appetite change, fever and unexpected weight change.  HENT:  Negative for congestion, ear pain, rhinorrhea, sinus pressure and sore throat.   Eyes:  Negative for pain, redness and visual disturbance.  Respiratory:  Negative for cough, shortness of breath and wheezing.   Cardiovascular:  Negative for chest pain and palpitations.  Gastrointestinal:  Negative for abdominal pain, blood in stool, constipation and diarrhea.  Endocrine: Negative for polydipsia and polyuria.  Genitourinary:  Negative for dysuria, frequency and urgency.  Musculoskeletal:  Negative for arthralgias, back pain and myalgias.  Skin:  Negative for pallor and rash.  Allergic/Immunologic: Negative for environmental allergies.  Neurological:  Negative for dizziness, syncope and headaches.       Headache is gone now   Hematological:  Negative for adenopathy. Does not bruise/bleed easily.  Psychiatric/Behavioral:  Negative for decreased concentration and dysphoric mood. The patient is not nervous/anxious.        Objective:   Physical Exam Constitutional:      General: She is not in acute distress.    Appearance: Normal appearance. She is well-developed. She is obese. She is not ill-appearing or diaphoretic.  HENT:     Head: Normocephalic and atraumatic.  Eyes:     Conjunctiva/sclera: Conjunctivae normal.     Pupils: Pupils are equal, round, and reactive to light.  Neck:     Thyroid: No thyromegaly.     Vascular: No carotid bruit or JVD.  Cardiovascular:     Rate and Rhythm: Normal rate and regular rhythm.  Heart sounds: Normal heart sounds.     No gallop.  Pulmonary:     Effort: Pulmonary effort is normal. No respiratory distress.     Breath sounds: Normal breath sounds. No wheezing or rales.  Abdominal:     General: There is no distension or abdominal bruit.     Palpations: Abdomen is soft. There is no hepatomegaly or splenomegaly.      Tenderness: There is no abdominal tenderness.  Musculoskeletal:     Cervical back: Normal range of motion and neck supple.     Right lower leg: No edema.     Left lower leg: No edema.  Lymphadenopathy:     Cervical: No cervical adenopathy.  Skin:    General: Skin is warm and dry.     Coloration: Skin is not pale.     Findings: No rash.  Neurological:     Mental Status: She is alert.     Coordination: Coordination normal.     Deep Tendon Reflexes: Reflexes are normal and symmetric. Reflexes normal.  Psychiatric:        Mood and Affect: Mood normal.           Assessment & Plan:   Problem List Items Addressed This Visit       Cardiovascular and Mediastinum   HYPERTENSION, BENIGN ESSENTIAL - Primary    Pt was seen for hypertensive urgency recently in ER Reviewed hospital records, lab results and studies in detail  Reassuring work up  Improved with dose of hydralazine and amlodipine in ER Now added amlodipine 5 mg to regular meds Continues metoprolol xl 50 mg daily and hctz 25 mg daily  Disc imp of water intake Also sodium avoidance DASH eating handout given  Will continue to monitor at home      Relevant Medications   amLODipine (NORVASC) 5 MG tablet

## 2022-01-12 NOTE — Patient Instructions (Addendum)
Drink water  Avoid excess sodium  Get exercise when you can   If any problems with your medicines let me know   Take care of yourself   Monitor bp as needed

## 2022-01-30 ENCOUNTER — Other Ambulatory Visit: Payer: Self-pay | Admitting: Family Medicine

## 2022-02-03 ENCOUNTER — Other Ambulatory Visit: Payer: Self-pay | Admitting: Family Medicine

## 2022-04-21 IMAGING — DX DG ANKLE COMPLETE 3+V*R*
3 series · 3 of 3 positions shown · non-contrast
Comparison: 08/09/2012.

CLINICAL DATA: Right ankle pain after fall.

EXAM:
RIGHT ANKLE - COMPLETE 3+ VIEW

[ankle ap]
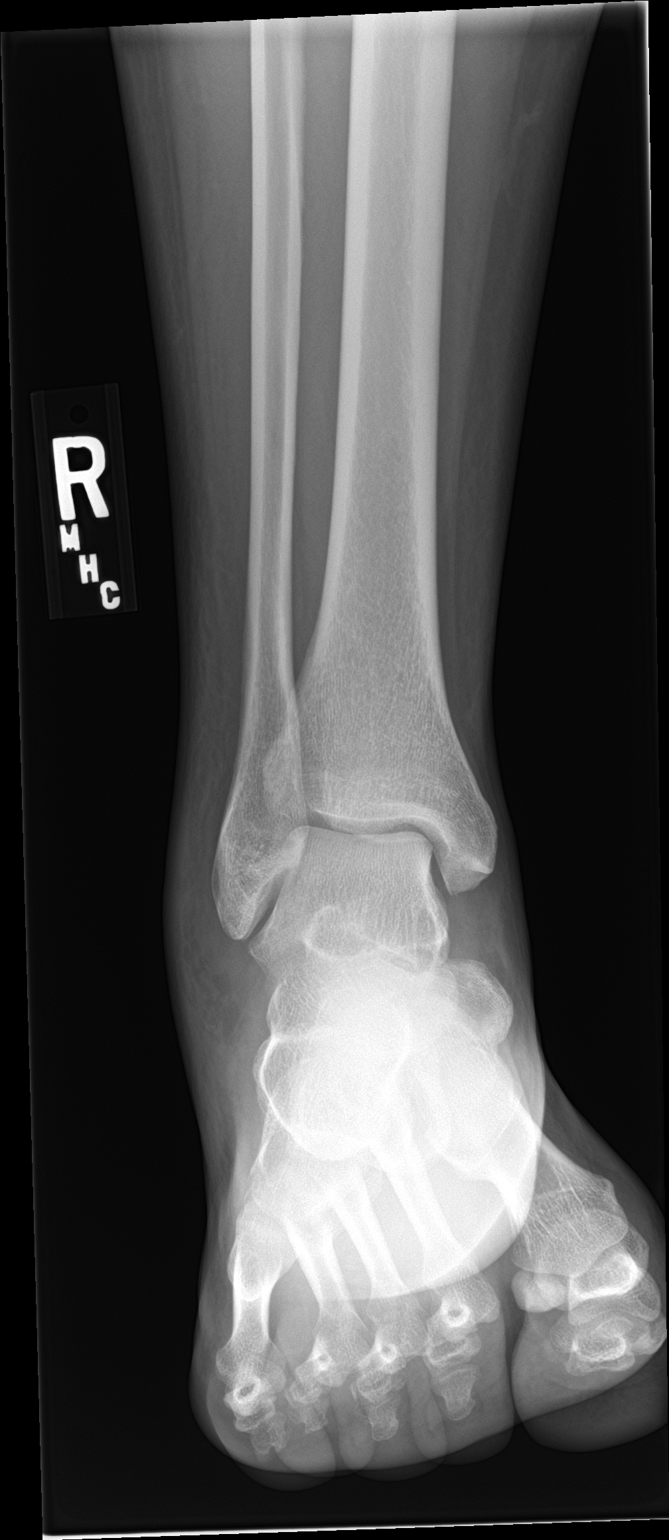

[ankle obl]
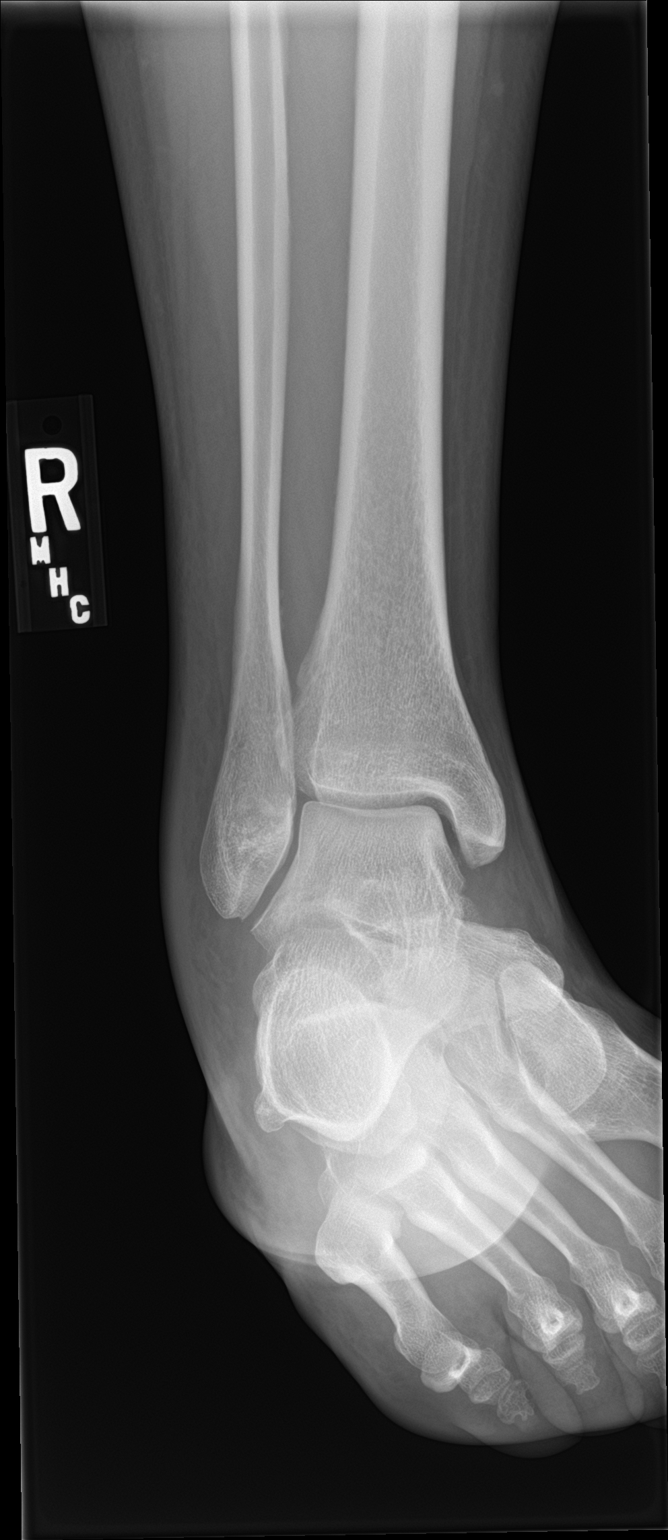

[ankle lat]
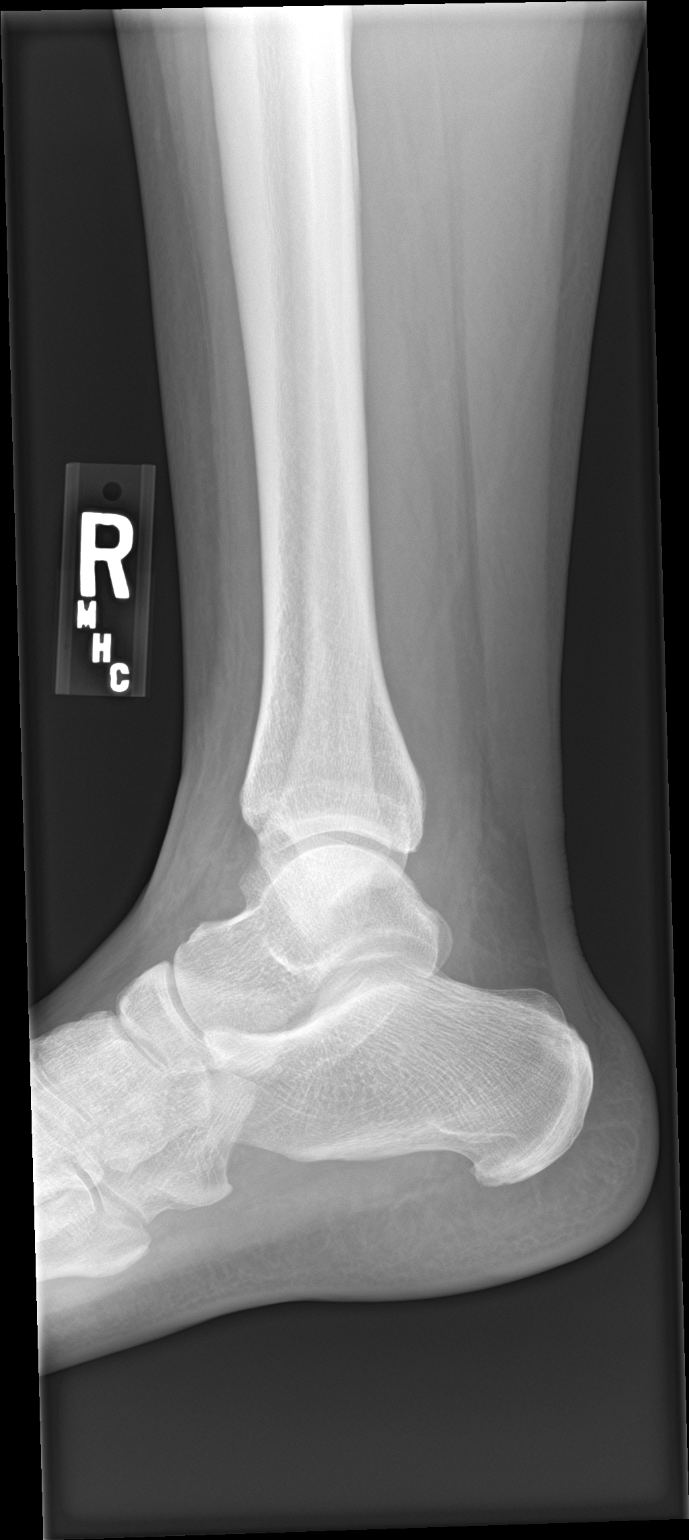

[3 of 3 positions shown; findings below may reference images not displayed]

FINDINGS: Diffuse soft tissue swelling, particularly prominent over the
lateral malleolus. No acute bony or joint abnormality. No evidence
of fracture or dislocation.
IMPRESSION: Soft tissue swelling, particularly prominent about the lateral
malleolus. No acute bony abnormality identified.

## 2022-05-08 ENCOUNTER — Other Ambulatory Visit: Payer: Self-pay | Admitting: Family Medicine

## 2022-05-09 ENCOUNTER — Other Ambulatory Visit: Payer: Self-pay | Admitting: Family Medicine

## 2022-06-05 ENCOUNTER — Ambulatory Visit (INDEPENDENT_AMBULATORY_CARE_PROVIDER_SITE_OTHER): Payer: BC Managed Care – PPO | Admitting: Family Medicine

## 2022-06-05 ENCOUNTER — Encounter: Payer: Self-pay | Admitting: Family Medicine

## 2022-06-05 VITALS — BP 124/76 | HR 67 | Temp 98.0°F | Ht 64.0 in | Wt 185.5 lb

## 2022-06-05 DIAGNOSIS — R058 Other specified cough: Secondary | ICD-10-CM | POA: Diagnosis not present

## 2022-06-05 MED ORDER — PROMETHAZINE-DM 6.25-15 MG/5ML PO SYRP: 5.0000 mL | ORAL_SOLUTION | Freq: Every evening | ORAL | 0 refills | Status: DC | PRN

## 2022-06-05 MED ORDER — PREDNISONE 10 MG PO TABS: ORAL_TABLET | ORAL | 0 refills | Status: DC

## 2022-06-05 MED ORDER — BENZONATATE 200 MG PO CAPS: 200.0000 mg | ORAL_CAPSULE | Freq: Three times a day (TID) | ORAL | 1 refills | Status: DC | PRN

## 2022-06-05 MED ORDER — ALBUTEROL SULFATE HFA 108 (90 BASE) MCG/ACT IN AERS
2.0000 | INHALATION_SPRAY | RESPIRATORY_TRACT | 3 refills | Status: DC | PRN
Start: 2022-06-05 — End: 2023-05-09

## 2022-06-05 NOTE — Assessment & Plan Note (Signed)
S/p uri in oct  No other symptoms  Reassuring exam / few rhonchi   Px prednisone for bronchitis Tessalon for day cough  Prometh DM for pm cough  Fluids/rest ER precautions  Update if not starting to improve in a week or if worsening   Consider cxr if not imp

## 2022-06-05 NOTE — Progress Notes (Signed)
Subjective:    Patient ID: Leslie Campbell, female    DOB: December 12, 1975, 46 y.o.   MRN: [id number]  HPI Pt presents for cold symptoms   Wt Readings from Last 3 Encounters:  06/05/22 185 lb 8 oz (84.1 kg)  01/12/22 177 lb 6.4 oz (80.5 kg)  12/28/21 180 lb 12.8 oz (82 kg)   31.84 kg/m   Had a cold in oct (covid test negative)  Cannot get rid of all the symptoms - cough  Cough is both dry and wet   Heat triggers cough  Being outside  Talk /laugh  Not much wheezing  Uses inhaler when she gets in cough fit  No sob    Otc Delsym  Robitussin  Lozenges  Nasal saline  Albuterol  Flonase    Not a lot of nasal symptoms  Not much throat clearing   Ears are ok  Throat is ok   Has never smoked   Sleeping ok  Uses her cpap machine - cough is not too bad   Patient Active Problem List   Diagnosis Date Noted   Post-viral cough syndrome 06/05/2022   Colon cancer screening 11/14/2021   Mild obstructive sleep apnea 10/30/2021   Snoring 05/17/2021   Screening-pulmonary TB 03/27/2021   Routine general medical examination at a health care facility 03/13/2017   Carpal tunnel syndrome 03/13/2017   Chronic hypertension affecting pregnancy 12/04/2016   IUGR (intrauterine growth restriction) affecting care of mother, third trimester, fetus 1 12/04/2016   Encounter for health maintenance examination 04/18/2014   Obesity (BMI 30-39.9) 06/10/2009   POLYCYSTIC OVARIAN DISEASE 03/15/2009   Hyperlipidemia 08/03/2008   HYPERTENSION, BENIGN ESSENTIAL 08/03/2008   Past Medical History:  Diagnosis Date   HTN (hypertension)    Hyperlipidemia    no meds   Infertility associated with anovulation    Infertility, female    clomid   Migraine    otc med prn   Miscarriage 01/2010   Miscarriage 2012   x 5 - only 1 required surgery   PCOS (polycystic ovarian syndrome)    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HCC)    Past Surgical History:  Procedure Laterality Date   CCY     CERVICAL CERCLAGE  N/A 06/22/2016   Procedure: McDonald CERCLAGE CERVICAL;  Surgeon: Olivia Mackie, MD;  Location: [facility];  Service: Gynecology;  Laterality: N/A;  EDD: 12/25/16   CESAREAN SECTION N/A 12/04/2016   Procedure: CESAREAN SECTION;  Surgeon: Olivia Mackie, MD;  Location: [facility] BIRTH[facility] SUIT[facility];  Service: Obstetrics;  Laterality: N/A;   CHOLECYSTECTOMY     DILATION AND CURETTAGE OF UTERUS     MAB   LAPAROSCOPIC LYSIS OF ADHESIONS  01/21/2017   Procedure: LAPAROSCOPIC LYSIS OF ADHESIONS;  Surgeon: Olivia Mackie, MD;  Location: [facility];  Service: Gynecology;;   LAPAROSCOPIC OVARIAN CYSTECTOMY Bilateral    LAPAROSCOPIC TUBAL LIGATION Bilateral 01/21/2017   Procedure: LAPAROSCOPIC TUBAL LIGATION;  Surgeon: Olivia Mackie, MD;  Location: [facility];  Service: Gynecology;  Laterality: Bilateral;   miscarriage  01/2010   WISDOM TOOTH EXTRACTION     Social History   Tobacco Use   Smoking status: Never   Smokeless tobacco: Never  Vaping Use   Vaping Use: Never used  Substance Use Topics   Alcohol use: No    Alcohol/week: 0.0 standard drinks of alcohol   Drug use: No   Family History  Problem Relation Age of Onset   Hypertension Mother    Hypertension Father    Diabetes Brother  Diabetes Other    No Known Allergies Current Outpatient Medications on File Prior to Visit  Medication Sig Dispense Refill   amLODipine (NORVASC) 5 MG tablet Take 1 tablet (5 mg total) by mouth daily. 90 tablet 3   atorvastatin (LIPITOR) 10 MG tablet TAKE 1 TABLET BY MOUTH EVERY DAY 90 tablet 1   cyclobenzaprine (FLEXERIL) 10 MG tablet Take 10 mg by mouth 2 (two) times daily as needed for muscle spasms.     famotidine (PEPCID) 20 MG tablet TAKE 1 TABLET BY MOUTH TWICE A DAY 180 tablet 1   fluticasone (FLONASE) 50 MCG/ACT nasal spray SPRAY 2 SPRAYS INTO EACH NOSTRIL EVERY DAY AS NEEDED 48 mL 1   hydrochlorothiazide (HYDRODIURIL) 25 MG tablet TAKE 1 TABLET (25 MG TOTAL) BY MOUTH DAILY. 90 tablet 1   metoprolol succinate  (TOPROL-XL) 50 MG 24 hr tablet TAKE 1 TABLET BY MOUTH EVERY DAY WITH OR IMMEDIATELY FOLLOWING A MEAL 90 tablet 1   potassium chloride (KLOR-CON) 10 MEQ tablet TAKE 1 TABLET BY MOUTH EVERY DAY 90 tablet 1   No current facility-administered medications on file prior to visit.    Review of Systems  Constitutional:  Negative for activity change, appetite change, fatigue, fever and unexpected weight change.  HENT:  Negative for congestion, ear pain, rhinorrhea, sinus pressure and sore throat.   Eyes:  Negative for pain, redness and visual disturbance.  Respiratory:  Positive for cough and wheezing. Negative for apnea, choking, chest tightness, shortness of breath and stridor.   Cardiovascular:  Negative for chest pain and palpitations.  Gastrointestinal:  Negative for abdominal pain, blood in stool, constipation and diarrhea.  Endocrine: Negative for polydipsia and polyuria.  Genitourinary:  Negative for dysuria, frequency and urgency.  Musculoskeletal:  Negative for arthralgias, back pain and myalgias.  Skin:  Negative for pallor and rash.  Allergic/Immunologic: Negative for environmental allergies.  Neurological:  Negative for dizziness, syncope and headaches.  Hematological:  Negative for adenopathy. Does not bruise/bleed easily.  Psychiatric/Behavioral:  Negative for decreased concentration and dysphoric mood. The patient is not nervous/anxious.        Objective:   Physical Exam Constitutional:      General: She is not in acute distress.    Appearance: Normal appearance. She is well-developed. She is obese. She is not ill-appearing or diaphoretic.  HENT:     Head: Normocephalic and atraumatic.  Eyes:     Conjunctiva/sclera: Conjunctivae normal.     Pupils: Pupils are equal, round, and reactive to light.  Neck:     Thyroid: No thyromegaly.     Vascular: No carotid bruit or JVD.  Cardiovascular:     Rate and Rhythm: Normal rate and regular rhythm.     Heart sounds: Normal heart  sounds.     No gallop.  Pulmonary:     Effort: Pulmonary effort is normal. No respiratory distress.     Breath sounds: No stridor. Rhonchi present. No wheezing or rales.     Comments: Scant scattered rhonchi Chest:     Chest wall: No tenderness.  Abdominal:     General: There is no distension or abdominal bruit.     Palpations: Abdomen is soft.  Musculoskeletal:     Cervical back: Normal range of motion and neck supple.     Right lower leg: No edema.     Left lower leg: No edema.  Lymphadenopathy:     Cervical: No cervical adenopathy.  Skin:    General: Skin is warm and  dry.     Coloration: Skin is not pale.     Findings: No rash.  Neurological:     Mental Status: She is alert.     Coordination: Coordination normal.     Deep Tendon Reflexes: Reflexes are normal and symmetric. Reflexes normal.  Psychiatric:        Mood and Affect: Mood normal.           Assessment & Plan:   Problem List Items Addressed This Visit       Respiratory   Post-viral cough syndrome - Primary    S/p uri in oct  No other symptoms  Reassuring exam / few rhonchi   Px prednisone for bronchitis Tessalon for day cough  Prometh DM for pm cough  Fluids/rest ER precautions  Update if not starting to improve in a week or if worsening   Consider cxr if not imp

## 2022-06-05 NOTE — Patient Instructions (Addendum)
Drink fluids   Take prednisone as directed (may make you hyper and hungry)  Tessalon for cough up to every 8 hour  Prometh-dm at bedtime (may sedate)   If not starting to improve in a week let me know

## 2022-09-08 ENCOUNTER — Other Ambulatory Visit: Payer: Self-pay | Admitting: Family Medicine

## 2022-09-24 ENCOUNTER — Ambulatory Visit (INDEPENDENT_AMBULATORY_CARE_PROVIDER_SITE_OTHER): Payer: BC Managed Care – PPO | Admitting: Pulmonary Disease

## 2022-09-24 ENCOUNTER — Encounter (HOSPITAL_BASED_OUTPATIENT_CLINIC_OR_DEPARTMENT_OTHER): Payer: Self-pay | Admitting: Pulmonary Disease

## 2022-09-24 VITALS — BP 128/78 | HR 62 | Ht 64.0 in | Wt 185.6 lb

## 2022-09-24 DIAGNOSIS — G4733 Obstructive sleep apnea (adult) (pediatric): Secondary | ICD-10-CM | POA: Diagnosis not present

## 2022-09-24 NOTE — Patient Instructions (Signed)
Follow up in 1 year.

## 2022-09-24 NOTE — Progress Notes (Signed)
[facility]  Chief Complaint  Patient presents with   Follow-up    F/U for CPAP. States she has been using her cpap machine every night. Uses [employer] for her DME company.     Past Surgical History:  She  has a past surgical history that includes CCY; miscarriage (01/2010); Cholecystectomy; Laparoscopic ovarian cystectomy (Bilateral); Wisdom tooth extraction; Cervical cerclage (N/A, 06/22/2016); Cesarean section (N/A, 12/04/2016); Dilation and curettage of uterus; Laparoscopic tubal ligation (Bilateral, 01/21/2017); and Laparoscopic lysis of adhesions (01/21/2017).  Past Medical History:  HTN, HLD, Migraine headaches, Polycystic ovarian syndrome, Pituitary hyperfunction  Constitutional:  BP 128/78   Pulse 62   Ht 5\' 4"  (1.626 m)   Wt 185 lb 9.6 oz (84.2 kg)   SpO2 99% Comment: on RA  BMI 31.86 kg/m   Brief Summary:  Leslie Campbell is a 47 y.o. female with obstructive sleep apnea      Subjective:   Home sleep study showed mild sleep apnea.  Started on CPAP.  Uses nightly.  Has hybrid mask.  Sleeping better.  Goes to bed at 930 pm.  Occasionally uses melatonin.  Sleeps through the night.  Gets up at 6 am.  Feels rested.  Physical Exam:   Appearance - well kempt   ENMT - no sinus tenderness, no oral exudate, no LAN, Mallampati 4 airway, no stridor  Respiratory - equal breath sounds bilaterally, no wheezing or rales  CV - s1s2 regular rate and rhythm, no murmurs  Ext - no clubbing, no edema  Skin - no rashes  Psych - normal mood and affect   Sleep Tests:  HST 10/19/20 >> AHI 8.5, SpO2 low 82%   Social History:  She  reports that she has never smoked. She has never used smokeless tobacco. She reports that she does not drink alcohol and does not use drugs.  Family History:  Her family history includes Diabetes in her brother and another family member; Hypertension in her father and mother.     Assessment/Plan:   Snoring  with excessive daytime sleepiness. - she is compliant with CPAP and reports benefit from therapy - she uses [employer] for her DME - current CPAP ordered May 2023 - continue auto CPAP 5 to 15 cm H2O - will call with results of her download   Time Spent Involved in Patient Care on Day of Examination:  15 minutes  Follow up:   Patient Instructions  Follow up in 1 year  Medication List:   Allergies as of 09/24/2022   No Known Allergies      Medication List        Accurate as of September 24, 2022  3:47 PM. If you have any questions, ask your nurse or doctor.          STOP taking these medications    benzonatate 200 MG capsule Commonly known as: TESSALON Stopped by: Chesley Mires, MD   predniSONE 10 MG tablet Commonly known as: DELTASONE Stopped by: Chesley Mires, MD   promethazine-dextromethorphan 6.25-15 MG/5ML syrup Commonly known as: PROMETHAZINE-DM Stopped by: Chesley Mires, MD       TAKE these medications    albuterol 108 (90 Base) MCG/ACT inhaler Commonly known as: VENTOLIN HFA Inhale 2 puffs into the lungs every 4 (four) hours as needed (cough, tight chest or wheezing).   amLODipine 5 MG tablet Commonly known as: NORVASC Take 1 tablet (5 mg total) by mouth daily.   atorvastatin 10 MG tablet Commonly  known as: LIPITOR TAKE 1 TABLET BY MOUTH EVERY DAY   cyclobenzaprine 10 MG tablet Commonly known as: FLEXERIL Take 10 mg by mouth 2 (two) times daily as needed for muscle spasms.   famotidine 20 MG tablet Commonly known as: PEPCID TAKE 1 TABLET BY MOUTH TWICE A DAY   fluticasone 50 MCG/ACT nasal spray Commonly known as: FLONASE SPRAY 2 SPRAYS INTO EACH NOSTRIL EVERY DAY AS NEEDED   hydrochlorothiazide 25 MG tablet Commonly known as: HYDRODIURIL TAKE 1 TABLET (25 MG TOTAL) BY MOUTH DAILY.   metoprolol succinate 50 MG 24 hr tablet Commonly known as: TOPROL-XL TAKE 1 TABLET BY MOUTH EVERY DAY WITH OR IMMEDIATELY FOLLOWING A MEAL   potassium chloride  10 MEQ tablet Commonly known as: KLOR-CON TAKE 1 TABLET BY MOUTH EVERY DAY        Signature:  Chesley Mires, MD [place] Pager - [phone] 09/24/2022, 3:47 PM

## 2022-10-12 ENCOUNTER — Telehealth: Payer: Self-pay | Admitting: Pulmonary Disease

## 2022-10-12 NOTE — Telephone Encounter (Signed)
Auto CPAP 08/28/22 to 09/26/22 >> used on 23 of 30 nights with average 6 hrs 13 min.  Average AHI 0.6 with median CPAP 6 and 95 th percentile CPAP 7 cm H2O   Please let her know her CPAP report shows good control with current settings.

## 2022-10-12 NOTE — Telephone Encounter (Signed)
Called and spoke w/ pt she verbalized understanding of VS message. NFN closing encounter 

## 2022-10-31 ENCOUNTER — Other Ambulatory Visit: Payer: Self-pay | Admitting: Family Medicine

## 2022-10-31 NOTE — Telephone Encounter (Signed)
Scheduled

## 2022-10-31 NOTE — Telephone Encounter (Signed)
Med refilled. Pt is due for her CPE (labs prior) on or after 11/16/22, please schedule

## 2022-11-02 LAB — HM MAMMOGRAPHY

## 2022-11-03 ENCOUNTER — Other Ambulatory Visit: Payer: Self-pay | Admitting: Family Medicine

## 2022-11-06 ENCOUNTER — Telehealth: Payer: Self-pay | Admitting: Family Medicine

## 2022-11-06 DIAGNOSIS — I10 Essential (primary) hypertension: Secondary | ICD-10-CM

## 2022-11-06 DIAGNOSIS — Z131 Encounter for screening for diabetes mellitus: Secondary | ICD-10-CM | POA: Insufficient documentation

## 2022-11-06 DIAGNOSIS — E669 Obesity, unspecified: Secondary | ICD-10-CM

## 2022-11-06 DIAGNOSIS — E7800 Pure hypercholesterolemia, unspecified: Secondary | ICD-10-CM

## 2022-11-06 DIAGNOSIS — R7303 Prediabetes: Secondary | ICD-10-CM | POA: Insufficient documentation

## 2022-11-06 NOTE — Telephone Encounter (Signed)
-----   Message from Terri J Walsh sent at 11/05/2022  1:06 PM EDT ----- Regarding: Lab orders for  Wednesday, 5.8.24 Patient is scheduled for CPX labs, please order future labs, Thanks , Terri   

## 2022-11-13 LAB — HM PAP SMEAR

## 2022-11-14 ENCOUNTER — Other Ambulatory Visit (INDEPENDENT_AMBULATORY_CARE_PROVIDER_SITE_OTHER): Payer: BC Managed Care – PPO

## 2022-11-14 DIAGNOSIS — I10 Essential (primary) hypertension: Secondary | ICD-10-CM | POA: Diagnosis not present

## 2022-11-14 DIAGNOSIS — Z131 Encounter for screening for diabetes mellitus: Secondary | ICD-10-CM

## 2022-11-14 DIAGNOSIS — E7800 Pure hypercholesterolemia, unspecified: Secondary | ICD-10-CM

## 2022-11-14 LAB — CBC WITH DIFFERENTIAL/PLATELET
Absolute Monocytes: 271 cells/uL (ref 200–950)
Basophils Absolute: 10 cells/uL (ref 0–200)
MCHC: 33.2 g/dL (ref 32.0–36.0)
MPV: 12 fL (ref 7.5–12.5)
Monocytes Relative: 8.2 %
Neutrophils Relative %: 51.9 %
Total Lymphocyte: 34.7 %
WBC: 3.3 10*3/uL — ABNORMAL LOW (ref 3.8–10.8)

## 2022-11-14 NOTE — Addendum Note (Signed)
Addended by: Alvina Chou on: 11/14/2022 07:39 AM   Modules accepted: Orders

## 2022-11-15 LAB — COMPREHENSIVE METABOLIC PANEL
AG Ratio: 1.4 (calc) (ref 1.0–2.5)
ALT: 11 U/L (ref 6–29)
AST: 15 U/L (ref 10–35)
Albumin: 3.7 g/dL (ref 3.6–5.1)
Alkaline phosphatase (APISO): 46 U/L (ref 31–125)
BUN: 12 mg/dL (ref 7–25)
CO2: 27 mmol/L (ref 20–32)
Calcium: 9.1 mg/dL (ref 8.6–10.2)
Chloride: 103 mmol/L (ref 98–110)
Creat: 0.88 mg/dL (ref 0.50–0.99)
Globulin: 2.6 g/dL (calc) (ref 1.9–3.7)
Glucose, Bld: 89 mg/dL (ref 65–99)
Potassium: 3.6 mmol/L (ref 3.5–5.3)
Sodium: 140 mmol/L (ref 135–146)
Total Bilirubin: 0.7 mg/dL (ref 0.2–1.2)
Total Protein: 6.3 g/dL (ref 6.1–8.1)

## 2022-11-15 LAB — CBC WITH DIFFERENTIAL/PLATELET
Basophils Relative: 0.3 %
Eosinophils Absolute: 162 cells/uL (ref 15–500)
Eosinophils Relative: 4.9 %
HCT: 35.2 % (ref 35.0–45.0)
Hemoglobin: 11.7 g/dL (ref 11.7–15.5)
Lymphs Abs: 1145 cells/uL (ref 850–3900)
MCH: 29.5 pg (ref 27.0–33.0)
MCV: 88.9 fL (ref 80.0–100.0)
Neutro Abs: 1713 cells/uL (ref 1500–7800)
Platelets: 269 10*3/uL (ref 140–400)
RBC: 3.96 10*6/uL (ref 3.80–5.10)
RDW: 13.3 % (ref 11.0–15.0)

## 2022-11-15 LAB — LIPID PANEL
Cholesterol: 152 mg/dL (ref <200)
HDL: 60 mg/dL (ref >=50)
LDL Cholesterol (Calc): 76 mg/dL (calc)
Non-HDL Cholesterol (Calc): 92 mg/dL (calc) (ref <130)
Total CHOL/HDL Ratio: 2.5 (calc) (ref <5.0)
Triglycerides: 78 mg/dL (ref <150)

## 2022-11-15 LAB — HEMOGLOBIN A1C
Hgb A1c MFr Bld: 5.6 % of total Hgb (ref <5.7)
Mean Plasma Glucose: 114 mg/dL
eAG (mmol/L): 6.3 mmol/L

## 2022-11-15 LAB — TSH: TSH: 1 mIU/L

## 2022-11-21 ENCOUNTER — Ambulatory Visit (INDEPENDENT_AMBULATORY_CARE_PROVIDER_SITE_OTHER): Payer: BC Managed Care – PPO | Admitting: Family Medicine

## 2022-11-21 ENCOUNTER — Encounter: Payer: Self-pay | Admitting: Family Medicine

## 2022-11-21 VITALS — BP 122/84 | HR 65 | Temp 98.0°F | Wt 182.5 lb

## 2022-11-21 DIAGNOSIS — Z1211 Encounter for screening for malignant neoplasm of colon: Secondary | ICD-10-CM

## 2022-11-21 DIAGNOSIS — E669 Obesity, unspecified: Secondary | ICD-10-CM

## 2022-11-21 DIAGNOSIS — E7800 Pure hypercholesterolemia, unspecified: Secondary | ICD-10-CM | POA: Diagnosis not present

## 2022-11-21 DIAGNOSIS — E282 Polycystic ovarian syndrome: Secondary | ICD-10-CM

## 2022-11-21 DIAGNOSIS — Z0000 Encounter for general adult medical examination without abnormal findings: Secondary | ICD-10-CM

## 2022-11-21 DIAGNOSIS — G4733 Obstructive sleep apnea (adult) (pediatric): Secondary | ICD-10-CM

## 2022-11-21 DIAGNOSIS — I10 Essential (primary) hypertension: Secondary | ICD-10-CM

## 2022-11-21 DIAGNOSIS — Z131 Encounter for screening for diabetes mellitus: Secondary | ICD-10-CM

## 2022-11-21 NOTE — Progress Notes (Unsigned)
Subjective:    Patient ID: Leslie Campbell, female    DOB: Mar 14, 1976, 47 y.o.   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    Immunization History  Administered Date(s) Administered   Influenza Split 05/16/2011   Influenza,inj,Quad PF,6+ Mos 04/26/2014, 02/28/2016, 03/13/2017, 03/27/2021   PFIZER(Purple Top)SARS-COV-2 Vaccination 02/11/2020, 03/03/2020   Td 04/02/2003   Tdap 04/26/2014, 10/04/2016   Health Maintenance Due  Topic Date Due   Hepatitis C Screening  Never done   COLONOSCOPY (Pts 45-64yrs Insurance coverage will need to be confirmed)  Never done   COVID-19 Vaccine (3 - 2023-24 season) 03/02/2022   MAMMOGRAM  08/22/2022   Colon cancer screening  Cologuard neg 10/2021  No bowel changes   Mammogram 08/2021- had one at gyn last month, we will request that record' May 3 -normal  Self breast exam -no lumps  H/o pcos  Lab Results  Component Value Date   HGBA1C 5.6 11/14/2022    Pap 08/2021 -normal at gyn / had one more recently, will send for that     HTN Ran out of amlodipine   BP Readings from Last 3 Encounters:  11/21/22 122/84  09/24/22 128/78  06/05/22 124/76   Is out of her amlodipine for 3 weeks   Metoprolol xl 50 mg daily  Hctz 25 mg daily  Klor con 10 meq daily   Has mild OSA Now cpap    More active-delivers mail   Diet - avoids salty food  Occ sweets- just a bite  More water    Some soda in evenings        Chemistry      Component Value Date/Time   NA 140 11/14/2022 0740   NA 141 03/13/2017 1700   K 3.6 11/14/2022 0740   CL 103 11/14/2022 0740   CO2 27 11/14/2022 0740   BUN 12 11/14/2022 0740   BUN  12 03/13/2017 1700   CREATININE 0.88 11/14/2022 0740      Component Value Date/Time   CALCIUM 9.1 11/14/2022 0740   ALKPHOS 74 03/13/2017 1700   AST 15 11/14/2022 0740   ALT 11 11/14/2022 0740   BILITOT 0.7 11/14/2022 0740   BILITOT 0.4 03/13/2017 1700     Normal GFR  Hyperlipidemia Lab Results  Component Value Date   CHOL 152 11/14/2022   CHOL 144 11/14/2021   CHOL 137 09/14/2020   Lab Results  Component Value Date   HDL 60 11/14/2022   HDL 66 11/14/2021   HDL 62 09/14/2020   Lab Results  Component Value Date   LDLCALC 76 11/14/2022   LDLCALC 61 11/14/2021   LDLCALC 62 09/14/2020   Lab Results  Component Value Date   TRIG 78 11/14/2022   TRIG 88 11/14/2021   TRIG 51 09/14/2020   Lab Results  Component Value Date   CHOLHDL 2.5 11/14/2022   CHOLHDL 2.2 11/14/2021   CHOLHDL 2.2 09/14/2020   Lab Results  Component Value Date   LDLDIRECT 169.5 08/03/2008   Atorvastatin 10 mg daily   Patient Active Problem List  Diagnosis Date Noted   Diabetes mellitus screening 11/06/2022   Colon cancer screening 11/14/2021   Mild obstructive sleep apnea 10/30/2021   Screening-pulmonary TB 03/27/2021   Routine general medical examination at a health care facility 03/13/2017   Carpal tunnel syndrome 03/13/2017   IUGR (intrauterine growth restriction) affecting care of mother, third trimester, fetus 1 12/04/2016   Obesity (BMI 30-39.9) 06/10/2009   POLYCYSTIC OVARIAN DISEASE 03/15/2009   Hyperlipidemia 08/03/2008   HYPERTENSION, BENIGN ESSENTIAL 08/03/2008   Past Medical History:  Diagnosis Date   GERD (gastroesophageal reflux disease) 2018   HTN (hypertension)    Hyperlipidemia    no meds   Infertility associated with anovulation    Infertility, female    clomid   Migraine    otc med prn   Miscarriage 01/2010   Miscarriage 2012   x 5 - only 1 required surgery   PCOS (polycystic ovarian syndrome)    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HCC)    Sleep apnea 2023   Past  Surgical History:  Procedure Laterality Date   CCY     CERVICAL CERCLAGE N/A 06/22/2016   Procedure: McDonald CERCLAGE CERVICAL;  Surgeon: Olivia Mackie, MD;  Location: [facility];  Service: Gynecology;  Laterality: N/A;  EDD: 12/25/16   CESAREAN SECTION N/A 12/04/2016   Procedure: CESAREAN SECTION;  Surgeon: Olivia Mackie, MD;  Location: [facility]IRTH[facility] SUIT[facility];  Service: Obstetrics;  Laterality: N/A;   CHOLECYSTECTOMY     DILATION AND CURETTAGE OF UTERUS     MAB   LAPAROSCOPIC LYSIS OF ADHESIONS  01/21/2017   Procedure: LAPAROSCOPIC LYSIS OF ADHESIONS;  Surgeon: Olivia Mackie, MD;  Location: [facility];  Service: Gynecology;;   LAPAROSCOPIC OVARIAN CYSTECTOMY Bilateral    LAPAROSCOPIC TUBAL LIGATION Bilateral 01/21/2017   Procedure: LAPAROSCOPIC TUBAL LIGATION;  Surgeon: Olivia Mackie, MD;  Location: [facility];  Service: Gynecology;  Laterality: Bilateral;   miscarriage  01/2010   TUBAL LIGATION     WISDOM TOOTH EXTRACTION     Social History   Tobacco Use   Smoking status: Never   Smokeless tobacco: Never  Vaping Use   Vaping Use: Never used  Substance Use Topics   Alcohol use: No   Drug use: No   Family History  Problem Relation Age of Onset   Hypertension Mother    Hypertension Father    Diabetes Brother    Diabetes Other    No Known Allergies Current Outpatient Medications on File Prior to Visit  Medication Sig Dispense Refill   albuterol (VENTOLIN HFA) 108 (90 Base) MCG/ACT inhaler Inhale 2 puffs into the lungs every 4 (four) hours as needed (cough, tight chest or wheezing). 18 each 3   atorvastatin (LIPITOR) 10 MG tablet TAKE 1 TABLET BY MOUTH EVERY DAY 90 tablet 0   cyclobenzaprine (FLEXERIL) 10 MG tablet Take 10 mg by mouth 2 (two) times daily as needed for muscle spasms.     famotidine (PEPCID) 20 MG tablet TAKE 1 TABLET BY MOUTH TWICE A DAY 180 tablet 1   fluticasone (FLONASE) 50 MCG/ACT nasal spray SPRAY 2 SPRAYS INTO EACH NOSTRIL EVERY DAY AS NEEDED 48 mL 1    hydrochlorothiazide (HYDRODIURIL) 25 MG tablet TAKE 1 TABLET (25 MG TOTAL) BY MOUTH DAILY. 90 tablet 1   metoprolol succinate (TOPROL-XL) 50 MG 24 hr tablet TAKE 1 TABLET BY MOUTH EVERY DAY WITH OR IMMEDIATELY FOLLOWING A MEAL 90 tablet 0   potassium chloride (KLOR-CON) 10 MEQ tablet TAKE 1 TABLET BY MOUTH EVERY DAY 90 tablet 0  No current facility-administered medications on file prior to visit.    Review of Systems  Constitutional:  Negative for activity change, appetite change, fatigue, fever and unexpected weight change.  HENT:  Negative for congestion, ear pain, rhinorrhea, sinus pressure and sore throat.   Eyes:  Negative for pain, redness and visual disturbance.  Respiratory:  Negative for cough, shortness of breath and wheezing.   Cardiovascular:  Negative for chest pain and palpitations.  Gastrointestinal:  Negative for abdominal pain, blood in stool, constipation and diarrhea.  Endocrine: Negative for polydipsia and polyuria.  Genitourinary:  Negative for dysuria, frequency and urgency.  Musculoskeletal:  Negative for arthralgias, back pain and myalgias.  Skin:  Negative for pallor and rash.  Allergic/Immunologic: Negative for environmental allergies.  Neurological:  Negative for dizziness, syncope and headaches.  Hematological:  Negative for adenopathy. Does not bruise/bleed easily.  Psychiatric/Behavioral:  Negative for decreased concentration and dysphoric mood. The patient is not nervous/anxious.        Objective:   Physical Exam Constitutional:      General: She is not in acute distress.    Appearance: Normal appearance. She is well-developed. She is obese. She is not ill-appearing or diaphoretic.  HENT:     Head: Normocephalic and atraumatic.     Right Ear: Tympanic membrane, ear canal and external ear normal.     Left Ear: Tympanic membrane, ear canal and external ear normal.     Nose: Nose normal. No congestion.     Mouth/Throat:     Mouth: Mucous membranes are  moist.     Pharynx: Oropharynx is clear. No posterior oropharyngeal erythema.  Eyes:     General: No scleral icterus.    Extraocular Movements: Extraocular movements intact.     Conjunctiva/sclera: Conjunctivae normal.     Pupils: Pupils are equal, round, and reactive to light.  Neck:     Thyroid: No thyromegaly.     Vascular: No carotid bruit or JVD.  Cardiovascular:     Rate and Rhythm: Normal rate and regular rhythm.     Pulses: Normal pulses.     Heart sounds: Normal heart sounds.     No gallop.  Pulmonary:     Effort: Pulmonary effort is normal. No respiratory distress.     Breath sounds: Normal breath sounds. No wheezing.     Comments: Good air exch Chest:     Chest wall: No tenderness.  Abdominal:     General: Bowel sounds are normal. There is no distension or abdominal bruit.     Palpations: Abdomen is soft. There is no mass.     Tenderness: There is no abdominal tenderness.     Hernia: No hernia is present.  Genitourinary:    Comments: Breast and pelvic exam done by gyn provider  Musculoskeletal:        General: No tenderness. Normal range of motion.     Cervical back: Normal range of motion and neck supple. No rigidity. No muscular tenderness.     Right lower leg: No edema.     Left lower leg: No edema.     Comments: No kyphosis   Lymphadenopathy:     Cervical: No cervical adenopathy.  Skin:    General: Skin is warm and dry.     Coloration: Skin is not pale.     Findings: No erythema or rash.  Neurological:     Mental Status: She is alert. Mental status is at baseline.     Cranial Nerves: No  cranial nerve deficit.     Motor: No abnormal muscle tone.     Coordination: Coordination normal.     Gait: Gait normal.     Deep Tendon Reflexes: Reflexes are normal and symmetric. Reflexes normal.  Psychiatric:        Mood and Affect: Mood normal.        Cognition and Memory: Cognition and memory normal.           Assessment & Plan:   Problem List Items  Addressed This Visit       Cardiovascular and Mediastinum   HYPERTENSION, BENIGN ESSENTIAL    bp in fair control at this time  BP Readings from Last 1 Encounters:  11/21/22 122/84  This is fine off amlodipine os instructed to stay off of it Continue hctz 25 mg daily and metoprolol xl 50 mg daily   Most recent labs reviewed  Disc lifstyle change with low sodium diet and exercise          Respiratory   Mild obstructive sleep apnea    On cpap Has helped This may be why bp is better also        Endocrine   POLYCYSTIC OVARIAN DISEASE    Lab Results  Component Value Date   HGBA1C 5.6 11/14/2022  Aware this can affect insulin tolerence disc imp of low glycemic diet and wt loss to prevent DM2         Other   Colon cancer screening    Cologuard utd and neg 5/2-23      Diabetes mellitus screening    Lab Results  Component Value Date   HGBA1C 5.6 11/14/2022  disc imp of low glycemic diet and wt loss to prevent DM2       Hyperlipidemia    Disc goals for lipids and reasons to control them Rev last labs with pt Rev low sat fat diet in detail  Controlled with atorvastatin 10 mg daily      Obesity (BMI 30-39.9)    Discussed how this problem influences overall health and the risks it imposes  Reviewed plan for weight loss with lower calorie diet (via better food choices and also portion control or program like weight watchers) and exercise building up to or more than 30 minutes 5 days per week including some aerobic activity  Encouraged to add some strength training       Routine general medical examination at a health care facility - Primary    Reviewed health habits including diet and exercise and skin cancer prevention Reviewed appropriate screening tests for age  Also reviewed health mt list, fam hx and immunization status , as well as social and family history   See HPI Labs reviewed and ordered Cologuard neg 10/2021 Mammogram last month at gyn- sent for that  report  Pap normal recently-sent for that as well

## 2022-11-21 NOTE — Patient Instructions (Addendum)
Try to get most of your carbohydrates from produce (with the exception of white potatoes)  Eat less bread/pasta/rice/snack foods/cereals/sweets and other items from the middle of the grocery store (processed carbs)  Consider sticking to non calorie beverages    Stay active Add some strength training to your routine, this is important for bone and brain health and can reduce your risk of falls and help your body use insulin properly and regulate weight  Light weights, exercise bands , and internet videos are a good way to start  Yoga (chair or regular), machines , floor exercises or a gym with machines are also good options     Stay off the amlodipine for now  If BP goes back up we can re start it

## 2022-11-22 NOTE — Assessment & Plan Note (Signed)
Reviewed health habits including diet and exercise and skin cancer prevention Reviewed appropriate screening tests for age  Also reviewed health mt list, fam hx and immunization status , as well as social and family history   See HPI Labs reviewed and ordered Cologuard neg 10/2021 Mammogram last month at gyn- sent for that report  Pap normal recently-sent for that as well

## 2022-11-22 NOTE — Assessment & Plan Note (Signed)
bp in fair control at this time  BP Readings from Last 1 Encounters:  11/21/22 122/84   This is fine off amlodipine os instructed to stay off of it Continue hctz 25 mg daily and metoprolol xl 50 mg daily   Most recent labs reviewed  Disc lifstyle change with low sodium diet and exercise

## 2022-11-22 NOTE — Assessment & Plan Note (Signed)
Lab Results  Component Value Date   HGBA1C 5.6 11/14/2022   disc imp of low glycemic diet and wt loss to prevent DM2

## 2022-11-22 NOTE — Assessment & Plan Note (Signed)
Cologuard utd and neg 5/2-23

## 2022-11-22 NOTE — Assessment & Plan Note (Signed)
Lab Results  Component Value Date   HGBA1C 5.6 11/14/2022   Aware this can affect insulin tolerence disc imp of low glycemic diet and wt loss to prevent DM2

## 2022-11-22 NOTE — Assessment & Plan Note (Signed)
Discussed how this problem influences overall health and the risks it imposes  Reviewed plan for weight loss with lower calorie diet (via better food choices and also portion control or program like weight watchers) and exercise building up to or more than 30 minutes 5 days per week including some aerobic activity  Encouraged to add some strength training  

## 2022-11-22 NOTE — Assessment & Plan Note (Signed)
Disc goals for lipids and reasons to control them Rev last labs with pt Rev low sat fat diet in detail Controlled with atorvastatin 10 mg daily 

## 2022-11-22 NOTE — Assessment & Plan Note (Signed)
On cpap Has helped This may be why bp is better also

## 2023-02-02 ENCOUNTER — Other Ambulatory Visit: Payer: Self-pay | Admitting: Family Medicine

## 2023-05-03 ENCOUNTER — Other Ambulatory Visit: Payer: Self-pay | Admitting: Family Medicine

## 2023-05-09 ENCOUNTER — Ambulatory Visit: Payer: BC Managed Care – PPO | Admitting: Family Medicine

## 2023-05-09 ENCOUNTER — Encounter: Payer: Self-pay | Admitting: Family Medicine

## 2023-05-09 VITALS — BP 128/78 | HR 69 | Temp 98.3°F | Ht 64.0 in | Wt 180.2 lb

## 2023-05-09 DIAGNOSIS — R052 Subacute cough: Secondary | ICD-10-CM | POA: Diagnosis not present

## 2023-05-09 DIAGNOSIS — Z23 Encounter for immunization: Secondary | ICD-10-CM

## 2023-05-09 DIAGNOSIS — R059 Cough, unspecified: Secondary | ICD-10-CM | POA: Insufficient documentation

## 2023-05-09 MED ORDER — PREDNISONE 10 MG PO TABS: ORAL_TABLET | ORAL | 0 refills | Status: DC

## 2023-05-09 MED ORDER — ALBUTEROL SULFATE HFA 108 (90 BASE) MCG/ACT IN AERS: 2.0000 | INHALATION_SPRAY | RESPIRATORY_TRACT | 3 refills | Status: AC | PRN

## 2023-05-09 MED ORDER — BENZONATATE 200 MG PO CAPS: 200.0000 mg | ORAL_CAPSULE | Freq: Three times a day (TID) | ORAL | 1 refills | Status: DC | PRN

## 2023-05-09 NOTE — Addendum Note (Signed)
Addended by: Shon Millet on: 05/09/2023 08:35 AM   Modules accepted: Orders

## 2023-05-09 NOTE — Patient Instructions (Addendum)
Drink fluids  Watch for fever or productive cough or other changes  Inhaler as needed Prednisone as directed (may cause hyper /hungry )   Delsym over the counter  Zyrtec 10 mg daily in evening as needed   Update if not starting to improve in a week or if worsening    Flu shot today

## 2023-05-09 NOTE — Progress Notes (Signed)
Subjective:    Patient ID: Leslie Campbell, female    DOB: 07/18/1975, 47 y.o.   MRN: [id number]  HPI  Wt Readings from Last 3 Encounters:  05/09/23 180 lb 4 oz (81.8 kg)  11/21/22 182 lb 8 oz (82.8 kg)  09/24/22 185 lb 9.6 oz (84.2 kg)   30.94 kg/m  Vitals:   05/09/23 0757  BP: 128/78  Pulse: 69  Temp: 98.3 F (36.8 C)  SpO2: 100%    Pt presents with c/o intermittent cough   Daughter was sick a month ago with virus (tested neg for everything)  Then she got a cough  No other symptoms  Dry and hacky   Triggered by coughing and laughing  Also by gettting hot   No wheezing  No shortness of breath   Little pnd  No sneezing Some runny nose on and off   No fever No sore throat  Some hoarse voice- for a week    Tried inhaler - helps a little    Over the counter Mucinex pm - dm and antihistamine Day quil  Lozenges   Tried allegra- did not help at all   Did a covid test at home-negative    Patient Active Problem List   Diagnosis Date Noted   Cough 05/09/2023   Diabetes mellitus screening 11/06/2022   Colon cancer screening 11/14/2021   Mild obstructive sleep apnea 10/30/2021   Screening-pulmonary TB 03/27/2021   Routine general medical examination at a health care facility 03/13/2017   Carpal tunnel syndrome 03/13/2017   IUGR (intrauterine growth restriction) affecting care of mother, third trimester, fetus 1 12/04/2016   Obesity (BMI 30-39.9) 06/10/2009   POLYCYSTIC OVARIAN DISEASE 03/15/2009   Hyperlipidemia 08/03/2008   HYPERTENSION, BENIGN ESSENTIAL 08/03/2008   Past Medical History:  Diagnosis Date   GERD (gastroesophageal reflux disease) 2018   HTN (hypertension)    Hyperlipidemia    no meds   Infertility associated with anovulation    Infertility, female    clomid   Migraine    otc med prn   Miscarriage 01/2010   Miscarriage 2012   x 5 - only 1 required surgery   PCOS (polycystic ovarian syndrome)    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HCC)     Sleep apnea 2023   Past Surgical History:  Procedure Laterality Date   CCY     CERVICAL CERCLAGE N/A 06/22/2016   Procedure: McDonald CERCLAGE CERVICAL;  Surgeon: Olivia Mackie, MD;  Location: [facility];  Service: Gynecology;  Laterality: N/A;  EDD: 12/25/16   CESAREAN SECTION N/A 12/04/2016   Procedure: CESAREAN SECTION;  Surgeon: Olivia Mackie, MD;  Location: [facility];  Service: Obstetrics;  Laterality: N/A;   CHOLECYSTECTOMY     DILATION AND CURETTAGE OF UTERUS     MAB   LAPAROSCOPIC LYSIS OF ADHESIONS  01/21/2017   Procedure: LAPAROSCOPIC LYSIS OF ADHESIONS;  Surgeon: Olivia Mackie, MD;  Location: [facility];  Service: Gynecology;;   LAPAROSCOPIC OVARIAN CYSTECTOMY Bilateral    LAPAROSCOPIC TUBAL LIGATION Bilateral 01/21/2017   Procedure: LAPAROSCOPIC TUBAL LIGATION;  Surgeon: Olivia Mackie, MD;  Location: [facility];  Service: Gynecology;  Laterality: Bilateral;   miscarriage  01/2010   TUBAL LIGATION     WISDOM TOOTH EXTRACTION     Social History   Tobacco Use   Smoking status: Never   Smokeless tobacco: Never  Vaping Use   Vaping status: Never Used  Substance Use Topics   Alcohol use: No   Drug use: No  Family History  Problem Relation Age of Onset   Hypertension Mother    Hypertension Father    Diabetes Brother    Diabetes Other    No Known Allergies Current Outpatient Medications on File Prior to Visit  Medication Sig Dispense Refill   atorvastatin (LIPITOR) 10 MG tablet TAKE 1 TABLET BY MOUTH EVERY DAY 90 tablet 2   famotidine (PEPCID) 20 MG tablet TAKE 1 TABLET BY MOUTH TWICE A DAY 180 tablet 1   fluticasone (FLONASE) 50 MCG/ACT nasal spray SPRAY 2 SPRAYS INTO EACH NOSTRIL EVERY DAY AS NEEDED 48 mL 1   hydrochlorothiazide (HYDRODIURIL) 25 MG tablet TAKE 1 TABLET (25 MG TOTAL) BY MOUTH DAILY. 90 tablet 1   metoprolol succinate (TOPROL-XL) 50 MG 24 hr tablet TAKE 1 TABLET BY MOUTH EVERY DAY WITH OR IMMEDIATELY FOLLOWING A MEAL 90 tablet 2   potassium  chloride (KLOR-CON) 10 MEQ tablet TAKE 1 TABLET BY MOUTH EVERY DAY 90 tablet 2   No current facility-administered medications on file prior to visit.    Review of Systems  Constitutional:  Negative for activity change, appetite change, fatigue, fever and unexpected weight change.  HENT:  Positive for postnasal drip. Negative for congestion, ear pain, rhinorrhea, sinus pressure and sore throat.   Eyes:  Negative for pain, redness and visual disturbance.  Respiratory:  Positive for cough. Negative for shortness of breath and wheezing.   Cardiovascular:  Negative for chest pain and palpitations.  Gastrointestinal:  Negative for abdominal pain, blood in stool, constipation and diarrhea.  Endocrine: Negative for polydipsia and polyuria.  Genitourinary:  Negative for dysuria, frequency and urgency.  Musculoskeletal:  Negative for arthralgias, back pain and myalgias.  Skin:  Negative for pallor and rash.  Allergic/Immunologic: Negative for environmental allergies.  Neurological:  Negative for dizziness, syncope and headaches.  Hematological:  Negative for adenopathy. Does not bruise/bleed easily.  Psychiatric/Behavioral:  Negative for decreased concentration and dysphoric mood. The patient is not nervous/anxious.        Objective:   Physical Exam Constitutional:      General: She is not in acute distress.    Appearance: Normal appearance. She is well-developed. She is obese.  HENT:     Head: Normocephalic and atraumatic.     Right Ear: Tympanic membrane and ear canal normal.     Left Ear: Tympanic membrane and ear canal normal.     Nose:     Comments: Boggy nares Eyes:     General:        Right eye: No discharge.        Left eye: No discharge.     Conjunctiva/sclera: Conjunctivae normal.     Pupils: Pupils are equal, round, and reactive to light.  Neck:     Thyroid: No thyromegaly.     Vascular: No carotid bruit or JVD.  Cardiovascular:     Rate and Rhythm: Normal rate and  regular rhythm.     Heart sounds: Normal heart sounds.     No gallop.  Pulmonary:     Effort: Pulmonary effort is normal. No respiratory distress.     Breath sounds: No stridor. Rhonchi present. No wheezing or rales.     Comments: Few scattered rhonchi  Scant wheeze on forced exp  No rales  Good air exch No prolonged exp phase  Abdominal:     General: There is no distension or abdominal bruit.     Palpations: Abdomen is soft.  Musculoskeletal:     Cervical back:  Normal range of motion and neck supple.     Right lower leg: No edema.     Left lower leg: No edema.  Lymphadenopathy:     Cervical: No cervical adenopathy.  Skin:    General: Skin is warm and dry.     Coloration: Skin is not pale.     Findings: No rash.  Neurological:     Mental Status: She is alert.     Coordination: Coordination normal.     Deep Tendon Reflexes: Reflexes are normal and symmetric. Reflexes normal.  Psychiatric:        Mood and Affect: Mood normal.           Assessment & Plan:   Problem List Items Addressed This Visit       Other   Cough - Primary    Post viral /allergic Reassuring exam/ few rhonchi and history of reactive airways with illness  Cough seems to be cyclic  Prescription  Prednisone  Tessalon   Over the counter recommended delsym and zyrtec  Update if not starting to improve in a week or if worsening  Call back and Er precautions noted in detail today

## 2023-05-09 NOTE — Assessment & Plan Note (Signed)
Post viral /allergic Reassuring exam/ few rhonchi and history of reactive airways with illness  Cough seems to be cyclic  Prescription  Prednisone  Tessalon   Over the counter recommended delsym and zyrtec  Update if not starting to improve in a week or if worsening  Call back and Er precautions noted in detail today

## 2023-05-31 ENCOUNTER — Other Ambulatory Visit: Payer: Self-pay | Admitting: Family Medicine

## 2023-11-02 ENCOUNTER — Other Ambulatory Visit: Payer: Self-pay | Admitting: Family Medicine

## 2023-11-04 NOTE — Telephone Encounter (Signed)
 Pt is due for a CPE (labs prior) on or after 11/22/23, med refilled once, please schedule appt

## 2023-11-05 NOTE — Telephone Encounter (Signed)
 Spoke to pt, scheduled cpe for 12/10/23

## 2023-11-18 ENCOUNTER — Telehealth: Payer: Self-pay | Admitting: Family Medicine

## 2023-11-18 DIAGNOSIS — Z0000 Encounter for general adult medical examination without abnormal findings: Secondary | ICD-10-CM

## 2023-11-18 DIAGNOSIS — I10 Essential (primary) hypertension: Secondary | ICD-10-CM

## 2023-11-18 DIAGNOSIS — Z131 Encounter for screening for diabetes mellitus: Secondary | ICD-10-CM

## 2023-11-18 DIAGNOSIS — E7800 Pure hypercholesterolemia, unspecified: Secondary | ICD-10-CM

## 2023-11-18 DIAGNOSIS — E669 Obesity, unspecified: Secondary | ICD-10-CM

## 2023-11-18 NOTE — Telephone Encounter (Signed)
-----   Message from Gerry Krone sent at 11/18/2023  8:40 AM EDT ----- Regarding: Lab orders for Tue, 6.3.25 Patient is scheduled for CPX labs, please order future labs, Thanks , Anselmo Kings

## 2023-12-03 ENCOUNTER — Other Ambulatory Visit (INDEPENDENT_AMBULATORY_CARE_PROVIDER_SITE_OTHER)

## 2023-12-03 DIAGNOSIS — E7800 Pure hypercholesterolemia, unspecified: Secondary | ICD-10-CM

## 2023-12-03 DIAGNOSIS — I10 Essential (primary) hypertension: Secondary | ICD-10-CM | POA: Diagnosis not present

## 2023-12-03 DIAGNOSIS — E669 Obesity, unspecified: Secondary | ICD-10-CM

## 2023-12-03 DIAGNOSIS — Z131 Encounter for screening for diabetes mellitus: Secondary | ICD-10-CM

## 2023-12-03 NOTE — Addendum Note (Signed)
 Addended by: Gerry Krone on: 12/03/2023 07:38 AM   Modules accepted: Orders

## 2023-12-04 ENCOUNTER — Ambulatory Visit: Payer: Self-pay | Admitting: Family Medicine

## 2023-12-04 LAB — COMPREHENSIVE METABOLIC PANEL WITH GFR
AG Ratio: 1.3 (calc) (ref 1.0–2.5)
ALT: 15 U/L (ref 6–29)
AST: 15 U/L (ref 10–35)
Albumin: 3.8 g/dL (ref 3.6–5.1)
Alkaline phosphatase (APISO): 47 U/L (ref 31–125)
BUN/Creatinine Ratio: 17 (calc) (ref 6–22)
BUN: 18 mg/dL (ref 7–25)
CO2: 26 mmol/L (ref 20–32)
Calcium: 8.9 mg/dL (ref 8.6–10.2)
Chloride: 106 mmol/L (ref 98–110)
Creat: 1.05 mg/dL — ABNORMAL HIGH (ref 0.50–0.99)
Globulin: 2.9 g/dL (ref 1.9–3.7)
Glucose, Bld: 89 mg/dL (ref 65–99)
Potassium: 3.9 mmol/L (ref 3.5–5.3)
Sodium: 139 mmol/L (ref 135–146)
Total Bilirubin: 0.8 mg/dL (ref 0.2–1.2)
Total Protein: 6.7 g/dL (ref 6.1–8.1)
eGFR: 66 mL/min/{1.73_m2} (ref >=60)

## 2023-12-04 LAB — CBC WITH DIFFERENTIAL/PLATELET
Absolute Lymphocytes: 1552 {cells}/uL (ref 850–3900)
Absolute Monocytes: 346 {cells}/uL (ref 200–950)
Basophils Absolute: 22 {cells}/uL (ref 0–200)
Basophils Relative: 0.6 %
Eosinophils Absolute: 122 {cells}/uL (ref 15–500)
Eosinophils Relative: 3.4 %
HCT: 33.8 % — ABNORMAL LOW (ref 35.0–45.0)
Hemoglobin: 10.7 g/dL — ABNORMAL LOW (ref 11.7–15.5)
MCH: 27.1 pg (ref 27.0–33.0)
MCHC: 31.7 g/dL — ABNORMAL LOW (ref 32.0–36.0)
MCV: 85.6 fL (ref 80.0–100.0)
MPV: 11.7 fL (ref 7.5–12.5)
Monocytes Relative: 9.6 %
Neutro Abs: 1559 {cells}/uL (ref 1500–7800)
Neutrophils Relative %: 43.3 %
Platelets: 270 10*3/uL (ref 140–400)
RBC: 3.95 10*6/uL (ref 3.80–5.10)
RDW: 15.6 % — ABNORMAL HIGH (ref 11.0–15.0)
Total Lymphocyte: 43.1 %
WBC: 3.6 10*3/uL — ABNORMAL LOW (ref 3.8–10.8)

## 2023-12-04 LAB — HEMOGLOBIN A1C
Hgb A1c MFr Bld: 5.7 % — ABNORMAL HIGH (ref <5.7)
Mean Plasma Glucose: 117 mg/dL
eAG (mmol/L): 6.5 mmol/L

## 2023-12-04 LAB — TSH: TSH: 1.1 m[IU]/L

## 2023-12-04 LAB — LIPID PANEL
Cholesterol: 149 mg/dL (ref <200)
HDL: 63 mg/dL (ref >=50)
LDL Cholesterol (Calc): 72 mg/dL
Non-HDL Cholesterol (Calc): 86 mg/dL (ref <130)
Total CHOL/HDL Ratio: 2.4 (calc) (ref <5.0)
Triglycerides: 61 mg/dL (ref <150)

## 2023-12-10 ENCOUNTER — Ambulatory Visit (INDEPENDENT_AMBULATORY_CARE_PROVIDER_SITE_OTHER): Admitting: Family Medicine

## 2023-12-10 ENCOUNTER — Encounter: Payer: Self-pay | Admitting: Family Medicine

## 2023-12-10 VITALS — BP 122/78 | HR 58 | Temp 98.7°F | Ht 64.25 in | Wt 178.4 lb

## 2023-12-10 DIAGNOSIS — I10 Essential (primary) hypertension: Secondary | ICD-10-CM

## 2023-12-10 DIAGNOSIS — R7303 Prediabetes: Secondary | ICD-10-CM

## 2023-12-10 DIAGNOSIS — D509 Iron deficiency anemia, unspecified: Secondary | ICD-10-CM | POA: Insufficient documentation

## 2023-12-10 DIAGNOSIS — E7800 Pure hypercholesterolemia, unspecified: Secondary | ICD-10-CM

## 2023-12-10 DIAGNOSIS — E876 Hypokalemia: Secondary | ICD-10-CM

## 2023-12-10 DIAGNOSIS — D500 Iron deficiency anemia secondary to blood loss (chronic): Secondary | ICD-10-CM

## 2023-12-10 DIAGNOSIS — Z1211 Encounter for screening for malignant neoplasm of colon: Secondary | ICD-10-CM | POA: Diagnosis not present

## 2023-12-10 DIAGNOSIS — Z0000 Encounter for general adult medical examination without abnormal findings: Secondary | ICD-10-CM

## 2023-12-10 DIAGNOSIS — E282 Polycystic ovarian syndrome: Secondary | ICD-10-CM

## 2023-12-10 DIAGNOSIS — E669 Obesity, unspecified: Secondary | ICD-10-CM

## 2023-12-10 DIAGNOSIS — N951 Menopausal and female climacteric states: Secondary | ICD-10-CM | POA: Insufficient documentation

## 2023-12-10 MED ORDER — POLYSACCHARIDE IRON COMPLEX 150 MG PO CAPS: 150.0000 mg | ORAL_CAPSULE | Freq: Every day | ORAL | 1 refills | Status: AC

## 2023-12-10 NOTE — Assessment & Plan Note (Signed)
 Reviewed health habits including diet and exercise and skin cancer prevention Reviewed appropriate screening tests for age  Also reviewed health mt list, fam hx and immunization status , as well as social and family history   See HPI Labs reviewed and ordered Health Maintenance  Topic Date Due   Hepatitis C Screening  Never done   Mammogram  11/02/2023   COVID-19 Vaccine (3 - 2024-25 season) 05/24/2025*   Flu Shot  01/31/2024   Cologuard (Stool DNA test)  11/20/2024   Pap with HPV screening  11/12/2025   DTaP/Tdap/Td vaccine (4 - Td or Tdap) 10/05/2026   HIV Screening  Completed   HPV Vaccine  Aged Out   Meningitis B Vaccine  Aged Out  *Topic was postponed. The date shown is not the original due date.   Planning gyn visit for exam and mammogram  Cologuard due in a year  Discussed fall prevention, supplements and exercise for bone density   PHQ 0

## 2023-12-10 NOTE — Assessment & Plan Note (Signed)
 Disc goals for lipids and reasons to control them Rev last labs with pt Rev low sat fat diet in detail  Controlled with atorvastatin  10 mg daily LDL of 72 with HDL over 60

## 2023-12-10 NOTE — Assessment & Plan Note (Signed)
 Discussed how this problem influences overall health and the risks it imposes  Reviewed plan for weight loss with lower calorie diet (via better food choices (lower glycemic and portion control) along with exercise building up to or more than 30 minutes 5 days per week including some aerobic activity and strength training

## 2023-12-10 NOTE — Assessment & Plan Note (Signed)
 Cologuard utd and neg 10/2021  Good for one more year

## 2023-12-10 NOTE — Assessment & Plan Note (Signed)
 Sees gyn  Heavier/longer menses  Some perimenopause symptoms A1c is up to 5.7

## 2023-12-10 NOTE — Patient Instructions (Addendum)
 Get scheduled for annual visit with gyn for exam and mammogram  Discuss treatment options for perimenopause   Make sure your multi vitamin has at least 2000 international units of vitamin D3 (daily)     Add some strength training to your routine, this is important for bone and brain health and can reduce your risk of falls and help your body use insulin properly and regulate weight  Light weights, exercise bands , and internet videos are a good way to start  Yoga (chair or regular), machines , floor exercises or a gym with machines are also good options    Your glucose is in the mild prediabetes range  Try to get most of your carbohydrates from produce (with the exception of white potatoes) and whole grains Eat less bread/pasta/rice/snack foods/cereals/sweets and other items from the middle of the grocery store (processed carbs)   Start the niferex for iron  Get miralax for constipation -mix with water , take it 1-3 times daily and over time you will get a sense of how much you need   I want to re check cbc and iron in about a month

## 2023-12-10 NOTE — Assessment & Plan Note (Signed)
 bp in fair control at this time  BP Readings from Last 1 Encounters:  12/10/23 122/78   This is fine off amlodipine  os instructed to stay off of it Continue hctz 25 mg daily and metoprolol  xl 50 mg daily   Most recent labs reviewed  Disc lifstyle change with low sodium diet and exercise

## 2023-12-10 NOTE — Assessment & Plan Note (Signed)
 Lab Results  Component Value Date   WBC 3.6 (L) 12/03/2023   HGB 10.7 (L) 12/03/2023   HCT 33.8 (L) 12/03/2023   MCV 85.6 12/03/2023   PLT 270 12/03/2023   Likely from heavy/long menses in perimenopause  Prescription niferex 150 mg to take daily as tolerated (use miralax for constipation)  Re check cbc and iron in a month

## 2023-12-10 NOTE — Assessment & Plan Note (Signed)
 Lab Results  Component Value Date   K 3.9 12/03/2023   Continue klor con 10 meq daily  Takes hydrochlorothiazide 

## 2023-12-10 NOTE — Assessment & Plan Note (Signed)
 With hot flashes Longer/heavier menses causing iron def anemia   Encouraged to d/w her gyn May benefit from hormonal therapy

## 2023-12-10 NOTE — Assessment & Plan Note (Signed)
 Prediabetes  Lab Results  Component Value Date   HGBA1C 5.7 (H) 12/03/2023   HGBA1C 5.6 11/14/2022   HGBA1C 5.1 11/14/2021    disc imp of low glycemic diet and wt loss to prevent DM2  Of note-pt had gestational dm and also has pcos so is at risk

## 2023-12-10 NOTE — Progress Notes (Signed)
 Subjective:    Patient ID: Leslie Campbell, female    DOB: [birth year], 48 y.o.   MRN: [id number]  HPI  Here for health maintenance exam and to review chronic medical problems   Wt Readings from Last 3 Encounters:  12/10/23 178 lb 6 oz (80.9 kg)  05/09/23 180 lb 4 oz (81.8 kg)  11/21/22 182 lb 8 oz (82.8 kg)   30.38 kg/m  Vitals:   12/10/23 1424  BP: 122/78  Pulse: (!) 58  Temp: 98.7 F (37.1 C)  SpO2: 100%    Immunization History  Administered Date(s) Administered   Influenza Split 05/16/2011   Influenza, Seasonal, Injecte, Preservative Fre 05/09/2023   Influenza,inj,Quad PF,6+ Mos 04/26/2014, 02/28/2016, 03/13/2017, 03/27/2021   PFIZER(Purple Top)SARS-COV-2 Vaccination 02/11/2020, 03/03/2020   Td 04/02/2003   Tdap 04/26/2014, 10/04/2016    Health Maintenance Due  Topic Date Due   Hepatitis C Screening  Never done   MAMMOGRAM  11/02/2023     Mammogram 10/2022 normal at gyn - just got a reminder  Self breast exam- no lumps   Gyn health  Pap 10/2022 , gyn  Has pcos  Cycles are getting longer now / always heavy   Hot flashes -is perimenopausal    Colon cancer screening -cologuard 10/2021   Bone health   Falls- none  Fractures-none  Supplements -mvi    Exercise : 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    Mood    12/10/2023    2:42 PM 05/09/2023    8:04 AM 11/21/2022    3:02 PM 01/12/2022    2:05 PM 11/14/2021    2:04 PM  Depression screen PHQ 2/9  Decreased Interest 0 0 0 0 0  Down, Depressed, Hopeless 0 0 0 0 0  PHQ - 2 Score 0 0 0 0 0  Altered sleeping 0 0 0    Tired, decreased energy 0 0 0    Change in appetite 0 0 0    Feeling bad or failure about yourself  0 0 0    Trouble concentrating 0 0 0    Moving slowly or fidgety/restless 0 0 0    Suicidal thoughts 0 0 0    PHQ-9 Score 0 0 0    Difficult doing work/chores Not difficult at all Not difficult at all Not difficult at all     HTN bp is stable today  No cp or  palpitations or headaches or edema  No side effects to medicines  BP Readings from Last 3 Encounters:  12/10/23 122/78  05/09/23 128/78  11/21/22 122/84    Hydrochlorothiazide  25 mg daily  Metopfolol xl 50 mg daily   Klor con 10 meq daily  K is normal   Pulse Readings from Last 3 Encounters:  12/10/23 (!) 58  05/09/23 69  11/21/22 65    Lab Results  Component Value Date   NA 139 12/03/2023   K 3.9 12/03/2023   CO2 26 12/03/2023   GLUCOSE 89 12/03/2023   BUN 18 12/03/2023   CREATININE 1.05 (H) 12/03/2023   CALCIUM  8.9 12/03/2023   GFR 69.49 04/19/2014   EGFR 66 12/03/2023   GFRNONAA >60 12/28/2021   Glucose Lab Results  Component Value Date   HGBA1C 5.7 (H) 12/03/2023   HGBA1C 5.6 11/14/2022   HGBA1C 5.1 11/14/2021   Has pcos  Had gestational diabetes in past    Hyperlipidemia Lab Results  Component Value Date   CHOL 149 12/03/2023  CHOL 152 11/14/2022   CHOL 144 11/14/2021   Lab Results  Component Value Date   HDL 63 12/03/2023   HDL 60 11/14/2022   HDL 66 11/14/2021   Lab Results  Component Value Date   LDLCALC 72 12/03/2023   LDLCALC 76 11/14/2022   LDLCALC 61 11/14/2021   Lab Results  Component Value Date   TRIG 61 12/03/2023   TRIG 78 11/14/2022   TRIG 88 11/14/2021   Lab Results  Component Value Date   CHOLHDL 2.4 12/03/2023   CHOLHDL 2.5 11/14/2022   CHOLHDL 2.2 11/14/2021   Lab Results  Component Value Date   LDLDIRECT 169.5 08/03/2008   Atorvastatin  10 mg daily    Lab Results  Component Value Date   TSH 1.10 12/03/2023   Lab Results  Component Value Date   ALT 15 12/03/2023   AST 15 12/03/2023   ALKPHOS 74 03/13/2017   BILITOT 0.8 12/03/2023   Lab Results  Component Value Date   WBC 3.6 (L) 12/03/2023   HGB 10.7 (L) 12/03/2023   HCT 33.8 (L) 12/03/2023   MCV 85.6 12/03/2023   PLT 270 12/03/2023   Has been craving ice       Patient Active Problem List   Diagnosis Date Noted   Hypokalemia 12/10/2023    Iron deficiency anemia 12/10/2023   Perimenopause 12/10/2023   Cough 05/09/2023   Prediabetes 11/06/2022   Colon cancer screening 11/14/2021   Mild obstructive sleep apnea 10/30/2021   Routine general medical examination at a health care facility 03/13/2017   Carpal tunnel syndrome 03/13/2017   Obesity (BMI 30-39.9) 06/10/2009   POLYCYSTIC OVARIAN DISEASE 03/15/2009   Hyperlipidemia 08/03/2008   HYPERTENSION, BENIGN ESSENTIAL 08/03/2008   Past Medical History:  Diagnosis Date   GERD (gastroesophageal reflux disease) 2018   HTN (hypertension)    Hyperlipidemia    no meds   Infertility associated with anovulation    Infertility, female    clomid   Migraine    otc med prn   Miscarriage 01/2010   Miscarriage 2012   x 5 - only 1 required surgery   PCOS (polycystic ovarian syndrome)    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HCC)    Sleep apnea 2023   Past Surgical History:  Procedure Laterality Date   CCY     CERVICAL CERCLAGE N/A 06/22/2016   Procedure: McDonald CERCLAGE CERVICAL;  Surgeon: Meriam Stamp, MD;  Location: [facility];  Service: Gynecology;  Laterality: N/A;  EDD: 12/25/16   CESAREAN SECTION N/A 12/04/2016   Procedure: CESAREAN SECTION;  Surgeon: Meriam Stamp, MD;  Location: [facility];  Service: Obstetrics;  Laterality: N/A;   CHOLECYSTECTOMY     DILATION AND CURETTAGE OF UTERUS     MAB   LAPAROSCOPIC LYSIS OF ADHESIONS  01/21/2017   Procedure: LAPAROSCOPIC LYSIS OF ADHESIONS;  Surgeon: Meriam Stamp, MD;  Location: [facility];  Service: Gynecology;;   LAPAROSCOPIC OVARIAN CYSTECTOMY Bilateral    LAPAROSCOPIC TUBAL LIGATION Bilateral 01/21/2017   Procedure: LAPAROSCOPIC TUBAL LIGATION;  Surgeon: Meriam Stamp, MD;  Location: [facility];  Service: Gynecology;  Laterality: Bilateral;   miscarriage  01/2010   TUBAL LIGATION     WISDOM TOOTH EXTRACTION     Social History   Tobacco Use   Smoking status: Never   Smokeless tobacco: Never  Vaping Use   Vaping status:  Never Used  Substance Use Topics   Alcohol use: No   Drug use: No   Family History  Problem Relation Age of  Onset   Hypertension Mother    Hypertension Father    Diabetes Brother    Diabetes Other    No Known Allergies Current Outpatient Medications on File Prior to Visit  Medication Sig Dispense Refill   albuterol  (VENTOLIN  HFA) 108 (90 Base) MCG/ACT inhaler Inhale 2 puffs into the lungs every 4 (four) hours as needed (cough, tight chest or wheezing). 18 each 3   atorvastatin  (LIPITOR) 10 MG tablet TAKE 1 TABLET BY MOUTH EVERY DAY 90 tablet 0   famotidine  (PEPCID ) 20 MG tablet TAKE 1 TABLET BY MOUTH TWICE A DAY 180 tablet 0   fluticasone  (FLONASE ) 50 MCG/ACT nasal spray SPRAY 2 SPRAYS INTO EACH NOSTRIL EVERY DAY AS NEEDED 48 mL 0   hydrochlorothiazide  (HYDRODIURIL ) 25 MG tablet TAKE 1 TABLET (25 MG TOTAL) BY MOUTH DAILY. 90 tablet 0   metoprolol  succinate (TOPROL -XL) 50 MG 24 hr tablet TAKE 1 TABLET BY MOUTH EVERY DAY WITH OR IMMEDIATELY FOLLOWING A MEAL 90 tablet 0   potassium chloride  (KLOR-CON ) 10 MEQ tablet TAKE 1 TABLET BY MOUTH EVERY DAY 90 tablet 0   No current facility-administered medications on file prior to visit.    Review of Systems  Constitutional:  Positive for fatigue. Negative for activity change, appetite change, fever and unexpected weight change.  HENT:  Negative for congestion, ear pain, rhinorrhea, sinus pressure and sore throat.   Eyes:  Negative for pain, redness and visual disturbance.  Respiratory:  Negative for cough, shortness of breath and wheezing.   Cardiovascular:  Negative for chest pain and palpitations.  Gastrointestinal:  Negative for abdominal pain, blood in stool, constipation and diarrhea.  Endocrine: Positive for heat intolerance. Negative for polydipsia and polyuria.  Genitourinary:  Positive for menstrual problem. Negative for dysuria, frequency and urgency.  Musculoskeletal:  Negative for arthralgias, back pain and myalgias.  Skin:   Negative for pallor and rash.  Allergic/Immunologic: Negative for environmental allergies.  Neurological:  Negative for dizziness, syncope and headaches.  Hematological:  Negative for adenopathy. Does not bruise/bleed easily.  Psychiatric/Behavioral:  Negative for decreased concentration and dysphoric mood. The patient is not nervous/anxious.        Objective:   Physical Exam Constitutional:      General: She is not in acute distress.    Appearance: Normal appearance. She is well-developed. She is obese. She is not ill-appearing or diaphoretic.  HENT:     Head: Normocephalic and atraumatic.     Right Ear: Tympanic membrane, ear canal and external ear normal.     Left Ear: Tympanic membrane, ear canal and external ear normal.     Nose: Nose normal. No congestion.     Mouth/Throat:     Mouth: Mucous membranes are moist.     Pharynx: Oropharynx is clear. No posterior oropharyngeal erythema.  Eyes:     General: No scleral icterus.    Extraocular Movements: Extraocular movements intact.     Conjunctiva/sclera: Conjunctivae normal.     Pupils: Pupils are equal, round, and reactive to light.  Neck:     Thyroid: No thyromegaly.     Vascular: No carotid bruit or JVD.  Cardiovascular:     Rate and Rhythm: Normal rate and regular rhythm.     Pulses: Normal pulses.     Heart sounds: Normal heart sounds.     No gallop.  Pulmonary:     Effort: Pulmonary effort is normal. No respiratory distress.     Breath sounds: Normal breath sounds. No wheezing.  Comments: Good air exch Chest:     Chest wall: No tenderness.  Abdominal:     General: Bowel sounds are normal. There is no distension or abdominal bruit.     Palpations: Abdomen is soft. There is no mass.     Tenderness: There is no abdominal tenderness.     Hernia: No hernia is present.  Genitourinary:    Comments: Breast and pelvic exam are done by gyn provider   Musculoskeletal:        General: No tenderness. Normal range of  motion.     Cervical back: Normal range of motion and neck supple. No rigidity. No muscular tenderness.     Right lower leg: No edema.     Left lower leg: No edema.     Comments: No kyphosis   Lymphadenopathy:     Cervical: No cervical adenopathy.  Skin:    General: Skin is warm and dry.     Coloration: Skin is not pale.     Findings: No erythema or rash.  Neurological:     Mental Status: She is alert. Mental status is at baseline.     Cranial Nerves: No cranial nerve deficit.     Motor: No abnormal muscle tone.     Coordination: Coordination normal.     Gait: Gait normal.     Deep Tendon Reflexes: Reflexes are normal and symmetric. Reflexes normal.  Psychiatric:        Mood and Affect: Mood normal.        Cognition and Memory: Cognition and memory normal.           Assessment & Plan:   Problem List Items Addressed This Visit       Cardiovascular and Mediastinum   HYPERTENSION, BENIGN ESSENTIAL   bp in fair control at this time  BP Readings from Last 1 Encounters:  12/10/23 122/78   This is fine off amlodipine  os instructed to stay off of it Continue hctz 25 mg daily and metoprolol  xl 50 mg daily   Most recent labs reviewed  Disc lifstyle change with low sodium diet and exercise           Endocrine   POLYCYSTIC OVARIAN DISEASE   Sees gyn  Heavier/longer menses  Some perimenopause symptoms A1c is up to 5.7           Other   Routine general medical examination at a health care facility - Primary   Reviewed health habits including diet and exercise and skin cancer prevention Reviewed appropriate screening tests for age  Also reviewed health mt list, fam hx and immunization status , as well as social and family history   See HPI Labs reviewed and ordered Health Maintenance  Topic Date Due   Hepatitis C Screening  Never done   Mammogram  11/02/2023   COVID-19 Vaccine (3 - 2024-25 season) 05/24/2025*   Flu Shot  01/31/2024   Cologuard (Stool DNA  test)  11/20/2024   Pap with HPV screening  11/12/2025   DTaP/Tdap/Td vaccine (4 - Td or Tdap) 10/05/2026   HIV Screening  Completed   HPV Vaccine  Aged Out   Meningitis B Vaccine  Aged Out  *Topic was postponed. The date shown is not the original due date.   Planning gyn visit for exam and mammogram  Cologuard due in a year  Discussed fall prevention, supplements and exercise for bone density   PHQ 0       Prediabetes   Prediabetes  Lab Results  Component Value Date   HGBA1C 5.7 (H) 12/03/2023   HGBA1C 5.6 11/14/2022   HGBA1C 5.1 11/14/2021    disc imp of low glycemic diet and wt loss to prevent DM2  Of note-pt had gestational dm and also has pcos so is at risk        Perimenopause   With hot flashes Longer/heavier menses causing iron def anemia   Encouraged to d/w her gyn May benefit from hormonal therapy       Obesity (BMI 30-39.9)   Discussed how this problem influences overall health and the risks it imposes  Reviewed plan for weight loss with lower calorie diet (via better food choices (lower glycemic and portion control) along with exercise building up to or more than 30 minutes 5 days per week including some aerobic activity and strength training         Iron deficiency anemia   Lab Results  Component Value Date   WBC 3.6 (L) 12/03/2023   HGB 10.7 (L) 12/03/2023   HCT 33.8 (L) 12/03/2023   MCV 85.6 12/03/2023   PLT 270 12/03/2023   Likely from heavy/long menses in perimenopause  Prescription niferex 150 mg to take daily as tolerated (use miralax for constipation)  Re check cbc and iron in a month      Relevant Medications   iron polysaccharides (NIFEREX) 150 MG capsule   Hypokalemia   Lab Results  Component Value Date   K 3.9 12/03/2023   Continue klor con 10 meq daily  Takes hydrochlorothiazide        Hyperlipidemia   Disc goals for lipids and reasons to control them Rev last labs with pt Rev low sat fat diet in detail  Controlled with  atorvastatin  10 mg daily LDL of 72 with HDL over 60       Colon cancer screening   Cologuard utd and neg 10/2021  Good for one more year

## 2024-01-07 ENCOUNTER — Other Ambulatory Visit (INDEPENDENT_AMBULATORY_CARE_PROVIDER_SITE_OTHER)

## 2024-01-07 DIAGNOSIS — D500 Iron deficiency anemia secondary to blood loss (chronic): Secondary | ICD-10-CM

## 2024-01-07 NOTE — Addendum Note (Signed)
 Addended by: LORELLE ROCKY BRAVO on: 01/07/2024 07:47 AM   Modules accepted: Orders

## 2024-01-08 ENCOUNTER — Ambulatory Visit: Payer: Self-pay | Admitting: Family Medicine

## 2024-01-08 DIAGNOSIS — D500 Iron deficiency anemia secondary to blood loss (chronic): Secondary | ICD-10-CM

## 2024-01-08 LAB — CBC WITH DIFFERENTIAL/PLATELET
Absolute Lymphocytes: 1161 {cells}/uL (ref 850–3900)
Absolute Monocytes: 200 {cells}/uL (ref 200–950)
Basophils Absolute: 30 {cells}/uL (ref 0–200)
Basophils Relative: 1.1 %
Eosinophils Absolute: 130 {cells}/uL (ref 15–500)
Eosinophils Relative: 4.8 %
HCT: 33.9 % — ABNORMAL LOW (ref 35.0–45.0)
Hemoglobin: 10.7 g/dL — ABNORMAL LOW (ref 11.7–15.5)
MCH: 27.5 pg (ref 27.0–33.0)
MCHC: 31.6 g/dL — ABNORMAL LOW (ref 32.0–36.0)
MCV: 87.1 fL (ref 80.0–100.0)
MPV: 11.5 fL (ref 7.5–12.5)
Monocytes Relative: 7.4 %
Neutro Abs: 1180 {cells}/uL — ABNORMAL LOW (ref 1500–7800)
Neutrophils Relative %: 43.7 %
Platelets: 290 Thousand/uL (ref 140–400)
RBC: 3.89 Million/uL (ref 3.80–5.10)
RDW: 16.6 % — ABNORMAL HIGH (ref 11.0–15.0)
Total Lymphocyte: 43 %
WBC: 2.7 Thousand/uL — ABNORMAL LOW (ref 3.8–10.8)

## 2024-01-08 LAB — IRON, TOTAL/TOTAL IRON BINDING CAP
%SAT: 10 % — ABNORMAL LOW (ref 16–45)
Iron: 30 ug/dL — ABNORMAL LOW (ref 40–190)
TIBC: 315 ug/dL (ref 250–450)

## 2024-01-08 LAB — FERRITIN: Ferritin: 12 ng/mL — ABNORMAL LOW (ref 16–232)

## 2024-01-09 ENCOUNTER — Telehealth: Payer: Self-pay | Admitting: *Deleted

## 2024-01-09 NOTE — Telephone Encounter (Signed)
 Pt viewed results and is aware but per Dr. Randeen, pt needs f/u lab appt in 2-4 weeks (non fasting). Please schedule when able. Thank

## 2024-01-28 ENCOUNTER — Other Ambulatory Visit (INDEPENDENT_AMBULATORY_CARE_PROVIDER_SITE_OTHER)

## 2024-01-28 DIAGNOSIS — D500 Iron deficiency anemia secondary to blood loss (chronic): Secondary | ICD-10-CM | POA: Diagnosis not present

## 2024-01-29 ENCOUNTER — Ambulatory Visit: Payer: Self-pay | Admitting: Family Medicine

## 2024-01-29 LAB — CBC WITH DIFFERENTIAL/PLATELET
Absolute Lymphocytes: 1498 {cells}/uL (ref 850–3900)
Absolute Monocytes: 182 {cells}/uL — ABNORMAL LOW (ref 200–950)
Basophils Absolute: 20 {cells}/uL (ref 0–200)
Basophils Relative: 0.6 %
Eosinophils Absolute: 112 {cells}/uL (ref 15–500)
Eosinophils Relative: 3.4 %
HCT: 37.4 % (ref 35.0–45.0)
Hemoglobin: 12 g/dL (ref 11.7–15.5)
MCH: 28.8 pg (ref 27.0–33.0)
MCHC: 32.1 g/dL (ref 32.0–36.0)
MCV: 89.7 fL (ref 80.0–100.0)
MPV: 12 fL (ref 7.5–12.5)
Monocytes Relative: 5.5 %
Neutro Abs: 1488 {cells}/uL — ABNORMAL LOW (ref 1500–7800)
Neutrophils Relative %: 45.1 %
Platelets: 223 Thousand/uL (ref 140–400)
RBC: 4.17 Million/uL (ref 3.80–5.10)
RDW: 17.3 % — ABNORMAL HIGH (ref 11.0–15.0)
Total Lymphocyte: 45.4 %
WBC: 3.3 Thousand/uL — ABNORMAL LOW (ref 3.8–10.8)

## 2024-01-29 LAB — IRON: Iron: 91 ug/dL (ref 40–190)

## 2024-01-29 LAB — FERRITIN: Ferritin: 23 ng/mL (ref 16–232)

## 2024-02-06 ENCOUNTER — Other Ambulatory Visit: Payer: Self-pay | Admitting: Family Medicine
